# Patient Record
Sex: Female | Born: 1985 | Hispanic: Yes | Marital: Single | Smoking: Never smoker
Health system: Southern US, Community
[De-identification: ages and names within clinical notes are randomized; demographics above are authoritative.]

## PROBLEM LIST (undated history)

## (undated) ENCOUNTER — Inpatient Hospital Stay (HOSPITAL_COMMUNITY): Payer: Self-pay

## (undated) DIAGNOSIS — Z6740 Type O blood, Rh positive: Secondary | ICD-10-CM

## (undated) DIAGNOSIS — E669 Obesity, unspecified: Secondary | ICD-10-CM

## (undated) DIAGNOSIS — D649 Anemia, unspecified: Secondary | ICD-10-CM

## (undated) DIAGNOSIS — J329 Chronic sinusitis, unspecified: Secondary | ICD-10-CM

## (undated) DIAGNOSIS — T7840XA Allergy, unspecified, initial encounter: Secondary | ICD-10-CM

## (undated) DIAGNOSIS — J45909 Unspecified asthma, uncomplicated: Secondary | ICD-10-CM

## (undated) DIAGNOSIS — R112 Nausea with vomiting, unspecified: Secondary | ICD-10-CM

## (undated) DIAGNOSIS — R87619 Unspecified abnormal cytological findings in specimens from cervix uteri: Secondary | ICD-10-CM

## (undated) DIAGNOSIS — IMO0002 Reserved for concepts with insufficient information to code with codable children: Secondary | ICD-10-CM

## (undated) DIAGNOSIS — G43909 Migraine, unspecified, not intractable, without status migrainosus: Secondary | ICD-10-CM

## (undated) DIAGNOSIS — Z9889 Other specified postprocedural states: Secondary | ICD-10-CM

## (undated) HISTORY — DX: Allergy, unspecified, initial encounter: T78.40XA

## (undated) HISTORY — PX: WISDOM TOOTH EXTRACTION: SHX21

## (undated) HISTORY — PX: LAPAROSCOPIC GASTRIC SLEEVE RESECTION: SHX5895

## (undated) HISTORY — DX: Obesity, unspecified: E66.9

## (undated) HISTORY — DX: Migraine, unspecified, not intractable, without status migrainosus: G43.909

## (undated) HISTORY — DX: Chronic sinusitis, unspecified: J32.9

## (undated) HISTORY — DX: Unspecified asthma, uncomplicated: J45.909

## (undated) HISTORY — DX: Unspecified abnormal cytological findings in specimens from cervix uteri: R87.619

## (undated) HISTORY — PX: COLPOSCOPY: SHX161

## (undated) HISTORY — DX: Anemia, unspecified: D64.9

## (undated) HISTORY — DX: Type O blood, Rh positive: Z67.40

## (undated) HISTORY — DX: Reserved for concepts with insufficient information to code with codable children: IMO0002

## (undated) HISTORY — PX: OTHER SURGICAL HISTORY: SHX169

---

## 2006-11-27 ENCOUNTER — Other Ambulatory Visit: Admission: RE | Admit: 2006-11-27 | Discharge: 2006-11-27 | Payer: Self-pay | Admitting: Obstetrics and Gynecology

## 2007-10-31 ENCOUNTER — Other Ambulatory Visit: Admission: RE | Admit: 2007-10-31 | Discharge: 2007-10-31 | Payer: Self-pay | Admitting: Obstetrics and Gynecology

## 2010-03-29 ENCOUNTER — Emergency Department (HOSPITAL_COMMUNITY)
Admission: EM | Admit: 2010-03-29 | Discharge: 2010-03-29 | Payer: Self-pay | Source: Home / Self Care | Admitting: Emergency Medicine

## 2011-05-11 ENCOUNTER — Ambulatory Visit (INDEPENDENT_AMBULATORY_CARE_PROVIDER_SITE_OTHER): Payer: 59 | Admitting: Physician Assistant

## 2011-05-11 VITALS — BP 90/54 | HR 94 | Temp 98.8°F | Resp 18 | Ht 61.0 in | Wt 242.8 lb

## 2011-05-11 DIAGNOSIS — R059 Cough, unspecified: Secondary | ICD-10-CM

## 2011-05-11 DIAGNOSIS — R05 Cough: Secondary | ICD-10-CM

## 2011-05-11 DIAGNOSIS — J019 Acute sinusitis, unspecified: Secondary | ICD-10-CM

## 2011-05-11 MED ORDER — HYDROCODONE-HOMATROPINE 5-1.5 MG/5ML PO SYRP: ORAL_SOLUTION | ORAL | Status: AC

## 2011-05-11 MED ORDER — AZITHROMYCIN 250 MG PO TABS: ORAL_TABLET | ORAL | Status: AC

## 2011-05-11 MED ORDER — IPRATROPIUM BROMIDE 0.06 % NA SOLN: 2.0000 | Freq: Three times a day (TID) | NASAL | Status: DC

## 2011-05-11 NOTE — Progress Notes (Signed)
    Patient ID: Ashley Barnes MRN: [id number], DOB: 1985/04/21, 26 y.o. Date of Encounter: 05/11/2011, 10:03 AM  Primary Physician: Virgilio Belling, PA  Chief Complaint:  Chief Complaint  Patient presents with  . Cough  . Nasal Congestion  . Otalgia  . Sore Throat    HPI: 26 y.o. year old female presents with 1 week history of worsening nasal congestion, post nasal drip, sinus pressure, and cough. Afebrile. No chills. Cough is productive of green sputum, worse in the morning. Nasal congestion thick and green. Sinus pressure greatest along the maxillary sinuses. Hearing muffled bilaterally with right otalgia. No drainage or discharge from the ears. No GI complaints. Has tried Benadryl and Sudafed without success. No sick contacts. No recent antibiotics.   No leg trauma, sedentary periods, h/o cancer, or tobacco use.  Past Medical History  Diagnosis Date  . Obesity      Home Meds: Prior to Admission medications   Not on File    Allergies:  Allergies  Allergen Reactions  . Penicillins Anaphylaxis  . Zocor (Simvastatin - High Dose) Hives and Rash    History   Social History  . Marital Status: Single    Spouse Name: N/A    Number of Children: N/A  . Years of Education: N/A   Occupational History  . Not on file.   Social History Main Topics  . Smoking status: Never Smoker   . Smokeless tobacco: Not on file  . Alcohol Use: Not on file  . Drug Use: Not on file  . Sexually Active: Not on file   Other Topics Concern  . Not on file   Social History Narrative  . No narrative on file     Review of Systems: Constitutional: negative for chills, fever, night sweats or weight changes Cardiovascular: negative for chest pain or palpitations Respiratory: negative for hemoptysis, wheezing, or shortness of breath Abdominal: negative for abdominal pain, nausea, vomiting or diarrhea Dermatological: negative for rash Neurologic: negative for headache   Physical  Exam: Blood pressure 90/54, pulse 94, temperature 98.8 F (37.1 C), temperature source Oral, resp. rate 18, height 5\' 1"  (1.549 m), weight 242 lb 12.8 oz (110.133 kg), last menstrual period 04/13/2011., Body mass index is 45.88 kg/(m^2). General: Well developed, well nourished, in no acute distress. Head: Normocephalic, atraumatic, eyes without discharge, sclera non-icteric, nares are congested. Bilateral auditory canals clear, TM's are without perforation, pearly grey with reflective cone of light bilaterally. Serous effusion bilaterally behind TM's. Maxillary sinus TTP bilaterally. Oral cavity moist, dentition normal. Posterior pharynx with post nasal drip and mild erythema. No peritonsillar abscess or tonsillar exudate. Neck: Supple. No thyromegaly. Full ROM. No lymphadenopathy. Lungs: Clear bilaterally to auscultation without wheezes, rales, or rhonchi. Breathing is unlabored. Heart: RRR with S1 S2. No murmurs, rubs, or gallops appreciated. Msk:  Strength and tone normal for age. Extremities: No clubbing or cyanosis. No edema. Neuro: Alert and oriented X 3. Moves all extremities spontaneously. CNII-XII grossly in tact. Psych:  Responds to questions appropriately with a normal affect.     ASSESSMENT AND PLAN:  26 y.o. year old female with sinobronchitis -Azithromycin 250 MG #6 2 po first day then 1 po next 4 days no RF -Atrovent NS 0.06% 2 sprays each nare bid prn #1 no RF Hycodan #4oz 1 tsp po q 4-6 hours prn cough no RF SED -Mucinex -Tylenol/Motrin prn -Rest/fluids -RTC precautions -RTC 3-5 days if no improvement  Signed, Eula Listen, PA-C 05/11/2011 10:03 AM

## 2011-05-13 ENCOUNTER — Telehealth: Payer: Self-pay

## 2011-05-13 NOTE — Telephone Encounter (Signed)
Pt states seen on Wednesday this week and needs an OOW note for Wednesday, Thursday, and Friday (today). States she is still not feeling better. 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  Pt ph: [phone]        Work fax: [phone]  Also had a question regarding her treatment. States she is on day 3 of her 4 day antibiotic for her ear. Asks when she should expect her ear to start feeling better because it still feels bad.   bf

## 2011-05-16 ENCOUNTER — Telehealth: Payer: Self-pay

## 2011-05-16 NOTE — Telephone Encounter (Signed)
Pt CB concerning OOW note and also reports that her cough is better after Abx, but her nose is still running and her ears are still bothering her and feel like there is "something in them" and popping/pressure. She requests another Abx, if possible, that her ears might respond better to.

## 2011-05-16 NOTE — Telephone Encounter (Signed)
Gave pt info from Maralyn Sago - pt agreed and faxed OOW note OK'd by Alycia Rossetti (see previous phone message)

## 2011-05-16 NOTE — Telephone Encounter (Signed)
Pt reports her medications had been sent to the wrong pharmacy last time. They should be [facility] to [facility] on [address] and [address], not the one on corner of [address]. I have changed pharmacy in her demographics, but may still need changed when meds are ordered.

## 2011-05-16 NOTE — Telephone Encounter (Signed)
Looks like pt had ETD and sinusitis - the ABX will treat the sinusitis which will allow the ears to improve but it may take 1-3 wks for ear fluid to drain properly.  Continue atrovent NS and mucinex that will hwlp with her ears.  Also take otc NSAIDs for the pain.

## 2011-05-17 NOTE — Telephone Encounter (Signed)
Spoke with pt, see notes in phone message 05/16/11

## 2011-12-23 ENCOUNTER — Ambulatory Visit (INDEPENDENT_AMBULATORY_CARE_PROVIDER_SITE_OTHER): Payer: 59 | Admitting: Physician Assistant

## 2011-12-23 VITALS — BP 126/77 | HR 107 | Temp 98.3°F | Resp 17 | Ht 60.5 in | Wt 224.0 lb

## 2011-12-23 DIAGNOSIS — J019 Acute sinusitis, unspecified: Secondary | ICD-10-CM

## 2011-12-23 DIAGNOSIS — J45909 Unspecified asthma, uncomplicated: Secondary | ICD-10-CM

## 2011-12-23 DIAGNOSIS — J309 Allergic rhinitis, unspecified: Secondary | ICD-10-CM | POA: Insufficient documentation

## 2011-12-23 MED ORDER — MONTELUKAST SODIUM 10 MG PO TABS: 10.0000 mg | ORAL_TABLET | Freq: Every day | ORAL | Status: DC

## 2011-12-23 MED ORDER — GUAIFENESIN ER 1200 MG PO TB12: 1.0000 | ORAL_TABLET | Freq: Two times a day (BID) | ORAL | Status: DC | PRN

## 2011-12-23 MED ORDER — IPRATROPIUM BROMIDE 0.03 % NA SOLN: 2.0000 | Freq: Two times a day (BID) | NASAL | Status: DC

## 2011-12-23 MED ORDER — BENZONATATE 100 MG PO CAPS: 100.0000 mg | ORAL_CAPSULE | Freq: Three times a day (TID) | ORAL | Status: DC | PRN

## 2011-12-23 MED ORDER — CLARITHROMYCIN ER 500 MG PO TB24: 1000.0000 mg | ORAL_TABLET | Freq: Every day | ORAL | Status: DC

## 2011-12-23 NOTE — Patient Instructions (Signed)
Get plenty of rest and drink at least 64 ounces of water daily. 

## 2011-12-23 NOTE — Progress Notes (Signed)
  Subjective:    Patient ID: Ashley Barnes, female    DOB: [birth year], 26 y.o.   MRN: [id number]  HPI  This 26 y.o. female presents for evaluation of scratchy sore throat beginning 12/19/2011 that has progressed to sneezing, coughing, mucous drainage, ear pooping, fullness and ache.    Has had asthma, recurrent sinusitis and otitis since early childhood.  Antihistamines make her feel dopey, so she doesn't take them, other than Benadryl at bedtime to help her sleep.  Claritin, Zyrtec and Allegra all cause the same sleepiness.   Review of Systems  As above.  No GI/GU symptoms.  No fever, chills.  No unexplained myalgias, arthralgias or rash.  Past Medical History  Diagnosis Date  . Obesity   . Allergy   . Asthma   . Recurrent sinusitis     Past Surgical History  Procedure Date  . Wisdom tooth extraction     Prior to Admission medications   Not on File    Allergies  Allergen Reactions  . Penicillins Anaphylaxis  . Corticosteroids Hives  . Zocor (Simvastatin - High Dose) Hives and Rash    History   Social History  . Marital Status: Single    Spouse Name: n/a    Number of Children: 0  . 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 Smoking status: Never Smoker   . Smokeless tobacco: Never Used  . Alcohol Use: No  . Drug Use: No  . Sexually Active: Not Currently   Other Topics Concern  . Not on file   Social History Narrative   Lives alone.    Family History  Problem Relation Age of Onset  . Diabetes Mother 28  . Heart disease Father 57    AMI x 3  . Hypertension Father        Objective:   Physical Exam  Blood pressure 126/77, pulse 107, temperature 98.3 F (36.8 C), temperature source Oral, resp. rate 17, height 5' 0.5" (1.537 m), weight 224 lb (101.606 kg), last menstrual period 12/18/2011, SpO2 96.00%. Body mass index is 43.03 kg/(m^2). Well-developed, well nourished BF who  is awake, alert and oriented, in NAD. HEENT: [place]/AT, PERRL, EOMI.  Sclera and conjunctiva are clear.  EAC are patent, TMs are normal in appearance. Nasal mucosa is congested, dark pink and moist. OP is clear. Frontal and maxillary sinuses are very tender. Neck: supple, no lymphadenopathy, no thyromegaly. Tender bilaterally. Heart: RRR, no murmur Lungs: normal effort, CTA Extremities: no cyanosis, clubbing or edema. Skin: warm and dry without rash. Psychologic: good mood and appropriate affect, normal speech and behavior.     Assessment & Plan:   1. Asthma  montelukast (SINGULAIR) 10 MG tablet  2. AR (allergic rhinitis)  ipratropium (ATROVENT) 0.03 % nasal spray, montelukast (SINGULAIR) 10 MG tablet  3. Acute sinusitis, unspecified  ipratropium (ATROVENT) 0.03 % nasal spray, Guaifenesin (MUCINEX MAXIMUM STRENGTH) 1200 MG TB12, clarithromycin (BIAXIN XL) 500 MG 24 hr tablet, benzonatate (TESSALON) 100 MG capsule   Unable to use steroid nasal spray due to allergy.  Antihistamines cause drowsiness.

## 2011-12-26 ENCOUNTER — Telehealth: Payer: Self-pay

## 2011-12-26 DIAGNOSIS — J309 Allergic rhinitis, unspecified: Secondary | ICD-10-CM

## 2011-12-26 DIAGNOSIS — J45909 Unspecified asthma, uncomplicated: Secondary | ICD-10-CM

## 2011-12-26 MED ORDER — MONTELUKAST SODIUM 10 MG PO TABS: 10.0000 mg | ORAL_TABLET | Freq: Every day | ORAL | Status: DC

## 2011-12-26 NOTE — Telephone Encounter (Signed)
Pt went to get her singlair filled and the pharmacy told her it needed to be a 90 day rx. Please call patient with any questions at [phone]

## 2011-12-26 NOTE — Telephone Encounter (Signed)
Changed Rx to 90 day w/1 RF and sent to pharmacy. Notified pt on VM done.

## 2012-06-25 ENCOUNTER — Encounter: Payer: Self-pay | Admitting: *Deleted

## 2012-06-25 ENCOUNTER — Ambulatory Visit (INDEPENDENT_AMBULATORY_CARE_PROVIDER_SITE_OTHER): Payer: 59 | Admitting: Family Medicine

## 2012-06-25 VITALS — BP 110/68 | HR 70 | Temp 98.3°F | Resp 16 | Ht 60.75 in | Wt 218.0 lb

## 2012-06-25 DIAGNOSIS — H6093 Unspecified otitis externa, bilateral: Secondary | ICD-10-CM

## 2012-06-25 DIAGNOSIS — H60399 Other infective otitis externa, unspecified ear: Secondary | ICD-10-CM

## 2012-06-25 DIAGNOSIS — J019 Acute sinusitis, unspecified: Secondary | ICD-10-CM

## 2012-06-25 MED ORDER — OFLOXACIN 0.3 % OT SOLN: 10.0000 [drp] | Freq: Every day | OTIC | Status: DC

## 2012-06-25 MED ORDER — AZITHROMYCIN 500 MG PO TABS: 500.0000 mg | ORAL_TABLET | Freq: Every day | ORAL | Status: DC

## 2012-06-25 NOTE — Patient Instructions (Signed)
Sinusitis Sinusitis is redness, soreness, and swelling (inflammation) of the paranasal sinuses. Paranasal sinuses are air pockets within the bones of your face (beneath the eyes, the middle of the forehead, or above the eyes). In healthy paranasal sinuses, mucus is able to drain out, and air is able to circulate through them by way of your nose. However, when your paranasal sinuses are inflamed, mucus and air can become trapped. This can allow bacteria and other germs to grow and cause infection. Sinusitis can develop quickly and last only a short time (acute) or continue over a long period (chronic). Sinusitis that lasts for more than 12 weeks is considered chronic.  CAUSES  Causes of sinusitis include:  Allergies.  Structural abnormalities, such as displacement of the cartilage that separates your nostrils (deviated septum), which can decrease the air flow through your nose and sinuses and affect sinus drainage.  Functional abnormalities, such as when the small hairs (cilia) that line your sinuses and help remove mucus do not work properly or are not present. SYMPTOMS  Symptoms of acute and chronic sinusitis are the same. The primary symptoms are pain and pressure around the affected sinuses. Other symptoms include:  Upper toothache.  Earache.  Headache.  Bad breath.  Decreased sense of smell and taste.  A cough, which worsens when you are lying flat.  Fatigue.  Fever.  Thick drainage from your nose, which often is green and may contain pus (purulent).  Swelling and warmth over the affected sinuses. DIAGNOSIS  Your caregiver will perform a physical exam. During the exam, your caregiver may:  Look in your nose for signs of abnormal growths in your nostrils (nasal polyps).  Tap over the affected sinus to check for signs of infection.  View the inside of your sinuses (endoscopy) with a special imaging device with a light attached (endoscope), which is inserted into your  sinuses. If your caregiver suspects that you have chronic sinusitis, one or more of the following tests may be recommended:  Allergy tests.  Nasal culture A sample of mucus is taken from your nose and sent to a lab and screened for bacteria.  Nasal cytology A sample of mucus is taken from your nose and examined by your caregiver to determine if your sinusitis is related to an allergy. TREATMENT  Most cases of acute sinusitis are related to a viral infection and will resolve on their own within 10 days. Sometimes medicines are prescribed to help relieve symptoms (pain medicine, decongestants, nasal steroid sprays, or saline sprays).  However, for sinusitis related to a bacterial infection, your caregiver will prescribe antibiotic medicines. These are medicines that will help kill the bacteria causing the infection.  Rarely, sinusitis is caused by a fungal infection. In theses cases, your caregiver will prescribe antifungal medicine. For some cases of chronic sinusitis, surgery is needed. Generally, these are cases in which sinusitis recurs more than 3 times per year, despite other treatments. HOME CARE INSTRUCTIONS   Drink plenty of water. Water helps thin the mucus so your sinuses can drain more easily.  Use a humidifier.  Inhale steam 3 to 4 times a day (for example, sit in the bathroom with the shower running).  Apply a warm, moist washcloth to your face 3 to 4 times a day, or as directed by your caregiver.  Use saline nasal sprays to help moisten and clean your sinuses.  Take over-the-counter or prescription medicines for pain, discomfort, or fever only as directed by your caregiver. SEEK IMMEDIATE MEDICAL   CARE IF:  You have increasing pain or severe headaches.  You have nausea, vomiting, or drowsiness.  You have swelling around your face.  You have vision problems.  You have a stiff neck.  You have difficulty breathing. MAKE SURE YOU:   Understand these  instructions.  Will watch your condition.  Will get help right away if you are not doing well or get worse. Document Released: 02/21/2005 Document Revised: 05/16/2011 Document Reviewed: 03/08/2011 ExitCare Patient Information 2013 [employer]. Otitis Externa Otitis externa is a bacterial or fungal infection of the outer ear canal. This is the area from the eardrum to the outside of the ear. Otitis externa is sometimes called "swimmer's ear." CAUSES  Possible causes of infection include:  Swimming in dirty water.  Moisture remaining in the ear after swimming or bathing.  Mild injury (trauma) to the ear.  Objects stuck in the ear (foreign body).  Cuts or scrapes (abrasions) on the outside of the ear. SYMPTOMS  The first symptom of infection is often itching in the ear canal. Later signs and symptoms may include swelling and redness of the ear canal, ear pain, and yellowish-white fluid (pus) coming from the ear. The ear pain may be worse when pulling on the earlobe. DIAGNOSIS  Your caregiver will perform a physical exam. A sample of fluid may be taken from the ear and examined for bacteria or fungi. TREATMENT  Antibiotic ear drops are often given for 10 to 14 days. Treatment may also include pain medicine or corticosteroids to reduce itching and swelling. PREVENTION   Keep your ear dry. Use the corner of a towel to absorb water out of the ear canal after swimming or bathing.  Avoid scratching or putting objects inside your ear. This can damage the ear canal or remove the protective wax that lines the canal. This makes it easier for bacteria and fungi to grow.  Avoid swimming in lakes, polluted water, or poorly chlorinated pools.  You may use ear drops made of rubbing alcohol and vinegar after swimming. Combine equal parts of white vinegar and alcohol in a bottle. Put 3 or 4 drops into each ear after swimming. HOME CARE INSTRUCTIONS   Apply antibiotic ear drops to the ear canal  as prescribed by your caregiver.  Only take over-the-counter or prescription medicines for pain, discomfort, or fever as directed by your caregiver.  If you have diabetes, follow any additional treatment instructions from your caregiver.  Keep all follow-up appointments as directed by your caregiver. SEEK MEDICAL CARE IF:   You have a fever.  Your ear is still red, swollen, painful, or draining pus after 3 days.  Your redness, swelling, or pain gets worse.  You have a severe headache.  You have redness, swelling, pain, or tenderness in the area behind your ear. MAKE SURE YOU:   Understand these instructions.  Will watch your condition.  Will get help right away if you are not doing well or get worse. Document Released: 02/21/2005 Document Revised: 05/16/2011 Document Reviewed: 03/10/2011 ExitCare Patient Information 2013 [employer].  

## 2012-06-25 NOTE — Progress Notes (Signed)
[facility]:  Office Visit  Chief Complaint:  Chief Complaint  Patient presents with  . Otalgia  . Cough    HPI: Ashley Barnes is a 27 y.o. female who complains of  4 day history of sharp ear pain, bilateral , right worse than left. She is training for triathlon. She has been swimming. She has sinus pain. No fevers or chills. No SOB or wheezing. Hx of asthma/allergies. Has taken her allergy meds without relief. Nonsmoker.    Past Medical History  Diagnosis Date  . Obesity   . Allergy   . Asthma   . Recurrent sinusitis    Past Surgical History  Procedure Laterality Date  . Wisdom tooth extraction     History   Social History  . Marital Status: Single    Spouse Name: n/a    Number of Children: 0  . 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 Smoking status: Never Smoker   . Smokeless tobacco: Never Used  . Alcohol Use: No  . Drug Use: No  . Sexually Active: Not Currently   Other Topics Concern  . None   Social History Narrative   Lives alone.   Family History  Problem Relation Age of Onset  . Diabetes Mother 85  . Heart disease Father 22    AMI x 3  . Hypertension Father    Allergies  Allergen Reactions  . Penicillins Anaphylaxis  . Corticosteroids Hives  . Zocor (Simvastatin - High Dose) Hives and Rash   Prior to Admission medications   Medication Sig Start Date End Date Taking? Authorizing Provider  montelukast (SINGULAIR) 10 MG tablet Take 1 tablet (10 mg total) by mouth at bedtime. 12/26/11  Yes Chelle S Jeffery, PA-C  benzonatate (TESSALON) 100 MG capsule Take 1-2 capsules (100-200 mg total) by mouth 3 (three) times daily as needed for cough. 12/23/11   Chelle S Jeffery, PA-C  clarithromycin (BIAXIN XL) 500 MG 24 hr tablet Take 2 tablets (1,000 mg total) by mouth daily. 12/23/11   Chelle S Jeffery, PA-C  Guaifenesin (MUCINEX MAXIMUM STRENGTH) 1200 MG TB12  Take 1 tablet (1,200 mg total) by mouth every 12 (twelve) hours as needed. 12/23/11   Chelle S Jeffery, PA-C  ipratropium (ATROVENT) 0.03 % nasal spray Place 2 sprays into the nose 2 (two) times daily. 12/23/11   Chelle S Jeffery, PA-C     ROS: The patient denies fevers, chills, night sweats, unintentional weight loss, chest pain, palpitations, wheezing, dyspnea on exertion, nausea, vomiting, abdominal pain, dysuria, hematuria, melena, numbness, weakness, or tingling.   All other systems have been reviewed and were otherwise negative with the exception of those mentioned in the HPI and as above.    PHYSICAL EXAM: Filed Vitals:   06/25/12 1651  BP: 110/68  Pulse: 70  Temp: 98.3 F (36.8 C)  Resp: 16   Filed Vitals:   06/25/12 1651  Height: 5' 0.75" (1.543 m)  Weight: 218 lb (98.884 kg)   Body mass index is 41.53 kg/(m^2).  General: Alert, no acute distress HEENT:  Normocephalic, atraumatic, oropharynx patent. Right TM  Bulging, + erythematous external canal, painful at tragus and with palpation. + nil sinus tenderness, erythematous nasal mucosa, no exudates, tonsils nl Cardiovascular:  Regular rate and rhythm, no rubs murmurs or gallops.  No Carotid bruits, radial pulse intact. No pedal edema.  Respiratory: Clear to auscultation bilaterally.  No wheezes, rales, or rhonchi.  No cyanosis, no use of accessory musculature GI: No organomegaly, abdomen is soft and non-tender, positive bowel sounds.  No masses. Skin: No rashes. Neurologic: Facial musculature symmetric. Psychiatric: Patient is appropriate throughout our interaction. Lymphatic: No cervical lymphadenopathy Musculoskeletal: Gait intact.   LABS: No results found for this or any previous visit.   EKG/XRAY:   Primary read interpreted by Dr. Conley Rolls at [facility].   ASSESSMENT/PLAN: Encounter Diagnoses  Name Primary?  . Acute sinusitis Yes  . Otitis external, bilateral    Rx Ocuflox for EO Rx azithromycin 500 mg daily x 5  days Avoid swimmignuntil sxs resolved and finished abx F/u prn    Nolah Krenzer PHUONG, DO 06/25/2012 5:14 PM

## 2012-09-01 ENCOUNTER — Ambulatory Visit (INDEPENDENT_AMBULATORY_CARE_PROVIDER_SITE_OTHER): Payer: 59 | Admitting: Emergency Medicine

## 2012-09-01 VITALS — BP 106/68 | HR 82 | Temp 98.2°F | Resp 16 | Ht 60.0 in | Wt 220.0 lb

## 2012-09-01 DIAGNOSIS — W57XXXA Bitten or stung by nonvenomous insect and other nonvenomous arthropods, initial encounter: Secondary | ICD-10-CM

## 2012-09-01 DIAGNOSIS — T148XXA Other injury of unspecified body region, initial encounter: Secondary | ICD-10-CM

## 2012-09-01 DIAGNOSIS — T148 Other injury of unspecified body region: Secondary | ICD-10-CM

## 2012-09-01 DIAGNOSIS — T1490XA Injury, unspecified, initial encounter: Secondary | ICD-10-CM

## 2012-09-01 MED ORDER — CETIRIZINE HCL 10 MG PO TABS: 10.0000 mg | ORAL_TABLET | Freq: Every day | ORAL | Status: DC

## 2012-09-01 NOTE — Progress Notes (Signed)
  Subjective:    Patient ID: Ashley Barnes, female    DOB: [birth year], 27 y.o.   MRN: [id number]  HPI 27 year old female presents with itchy, burning bumps covering lower legs  Went to [place], [place] for 5 days 08/26/12-08/31/12--- returned yesterday  patient went to beach, did not lay on beach   LMP 4 days ago  Left ear itchy and sore  Review of Systems     Objective:   Physical Exam Multiple bite-like areas over both lower legs. There are no lesions present on her trunk or arms. There are no lesions on her hand       Assessment & Plan:  These areas are most consistent with sand flea bites. She is allergic to cortisone. We'll treat with Benadryl Zyrtec and Zantac. She can apply drying solutions witch hazel or alcohol which ever she prefers .

## 2013-01-07 ENCOUNTER — Telehealth: Payer: Self-pay | Admitting: Certified Nurse Midwife

## 2013-01-07 ENCOUNTER — Telehealth: Payer: Self-pay | Admitting: Emergency Medicine

## 2013-01-07 NOTE — Telephone Encounter (Signed)
agree

## 2013-01-07 NOTE — Telephone Encounter (Signed)
Patient wants to speak with nurse re: "extremely heavy periods?" Please advise? Patient has AEX 01/10/13 but wants to be sure she doesn't need to be seen sooner.

## 2013-01-07 NOTE — Telephone Encounter (Signed)
Encounter opened in erroneously.

## 2013-01-07 NOTE — Telephone Encounter (Signed)
Spoke with patient. She currently is on her period. Last night used 3 super tampons over 9 hour period, having some clots as well. Currently not on any birth control. Last coitus in July. States that she is not pregnant, has been having monthly periods. She has a history of heavy periods and used birth control to control heavy periods. Would like to discuss other options with Debbi regarding birth control that is not the pill. Patient has aex scheduled for 11/6. Declines OV appointment for today to discuss bleeding issues. Advised patient to return call if bleeding increases, patient agreeable.

## 2013-01-09 ENCOUNTER — Encounter: Payer: Self-pay | Admitting: Certified Nurse Midwife

## 2013-01-10 ENCOUNTER — Encounter: Payer: Self-pay | Admitting: Certified Nurse Midwife

## 2013-01-10 ENCOUNTER — Ambulatory Visit (INDEPENDENT_AMBULATORY_CARE_PROVIDER_SITE_OTHER): Payer: 59 | Admitting: Certified Nurse Midwife

## 2013-01-10 VITALS — BP 112/70 | HR 68 | Resp 16 | Ht 61.0 in | Wt 232.0 lb

## 2013-01-10 DIAGNOSIS — Z01419 Encounter for gynecological examination (general) (routine) without abnormal findings: Secondary | ICD-10-CM

## 2013-01-10 DIAGNOSIS — N920 Excessive and frequent menstruation with regular cycle: Secondary | ICD-10-CM

## 2013-01-10 DIAGNOSIS — Z0000 Encounter for general adult medical examination without abnormal findings: Secondary | ICD-10-CM

## 2013-01-10 LAB — LIPID PANEL
LDL Cholesterol: 120 mg/dL — ABNORMAL HIGH (ref 0–99)
Total CHOL/HDL Ratio: 4.1 Ratio
VLDL: 24 mg/dL (ref 0–40)

## 2013-01-10 LAB — COMPREHENSIVE METABOLIC PANEL
ALT: 13 U/L (ref 0–35)
AST: 21 U/L (ref 0–37)
Albumin: 4.1 g/dL (ref 3.5–5.2)
CO2: 28 mEq/L (ref 19–32)
Calcium: 9.6 mg/dL (ref 8.4–10.5)
Chloride: 103 mEq/L (ref 96–112)
Creat: 0.89 mg/dL (ref 0.50–1.10)
Potassium: 4.3 mEq/L (ref 3.5–5.3)
Total Protein: 7.2 g/dL (ref 6.0–8.3)

## 2013-01-10 LAB — POCT URINALYSIS DIPSTICK
Glucose, UA: NEGATIVE
Urobilinogen, UA: NEGATIVE
pH, UA: 5

## 2013-01-10 LAB — HEMOGLOBIN A1C: Hgb A1c MFr Bld: 5.2 % (ref <5.7)

## 2013-01-10 LAB — HEMOGLOBIN, FINGERSTICK: Hemoglobin, fingerstick: 12.7 g/dL (ref 12.0–16.0)

## 2013-01-10 MED ORDER — ETONOGESTREL-ETHINYL ESTRADIOL 0.12-0.015 MG/24HR VA RING: 1.0000 | VAGINAL_RING | Freq: Once | VAGINAL | Status: DC

## 2013-01-10 NOTE — Patient Instructions (Signed)
General topics  Next pap or exam is  due in 1 year Take a Women's multivitamin Take 1200 mg. of calcium daily - prefer dietary If any concerns in interim to call back  Breast Self-Awareness Practicing breast self-awareness may pick up problems early, prevent significant medical complications, and possibly save your life. By practicing breast self-awareness, you can become familiar with how your breasts look and feel and if your breasts are changing. This allows you to notice changes early. It can also offer you some reassurance that your breast health is good. One way to learn what is normal for your breasts and whether your breasts are changing is to do a breast self-exam. If you find a lump or something that was not present in the past, it is best to contact your caregiver right away. Other findings that should be evaluated by your caregiver include nipple discharge, especially if it is bloody; skin changes or reddening; areas where the skin seems to be pulled in (retracted); or new lumps and bumps. Breast pain is seldom associated with cancer (malignancy), but should also be evaluated by a caregiver. BREAST SELF-EXAM The best time to examine your breasts is 5 7 days after your menstrual period is over.  ExitCare Patient Information 2013 [employer].   Exercise to Stay Healthy Exercise helps you become and stay healthy. EXERCISE IDEAS AND TIPS Choose exercises that:  You enjoy.  Fit into your day. You do not need to exercise really hard to be healthy. You can do exercises at a slow or medium level and stay healthy. You can:  Stretch before and after working out.  Try yoga, Pilates, or tai chi.  Lift weights.  Walk fast, swim, jog, run, climb stairs, bicycle, dance, or rollerskate.  Take aerobic classes. Exercises that burn about 150 calories:  Running 1  miles in 15 minutes.  Playing volleyball for 45 to 60 minutes.  Washing and waxing a car for 45 to 60  minutes.  Playing touch football for 45 minutes.  Walking 1  miles in 35 minutes.  Pushing a stroller 1  miles in 30 minutes.  Playing basketball for 30 minutes.  Raking leaves for 30 minutes.  Bicycling 5 miles in 30 minutes.  Walking 2 miles in 30 minutes.  Dancing for 30 minutes.  Shoveling snow for 15 minutes.  Swimming laps for 20 minutes.  Walking up stairs for 15 minutes.  Bicycling 4 miles in 15 minutes.  Gardening for 30 to 45 minutes.  Jumping rope for 15 minutes.  Washing windows or floors for 45 to 60 minutes. Document Released: 03/26/2010 Document Revised: 05/16/2011 Document Reviewed: 03/26/2010 ExitCare Patient Information 2013 [employer].   Other topics ( that may be useful information):    Sexually Transmitted Disease Sexually transmitted disease (STD) refers to any infection that is passed from person to person during sexual activity. This may happen by way of saliva, semen, blood, vaginal mucus, or urine. Common STDs include:  Gonorrhea.  Chlamydia.  Syphilis.  HIV/AIDS.  Genital herpes.  Hepatitis B and C.  Trichomonas.  Human papillomavirus (HPV).  Pubic lice. CAUSES  An STD may be spread by bacteria, virus, or parasite. A person can get an STD by:  Sexual intercourse with an infected person.  Sharing sex toys with an infected person.  Sharing needles with an infected person.  Having intimate contact with the genitals, mouth, or rectal areas of an infected person. SYMPTOMS  Some people may not have any symptoms, but   they can still pass the infection to others. Different STDs have different symptoms. Symptoms include:  Painful or bloody urination.  Pain in the pelvis, abdomen, vagina, anus, throat, or eyes.  Skin rash, itching, irritation, growths, or sores (lesions). These usually occur in the genital or anal area.  Abnormal vaginal discharge.  Penile discharge in men.  Soft, flesh-colored skin growths in the  genital or anal area.  Fever.  Pain or bleeding during sexual intercourse.  Swollen glands in the groin area.  Yellow skin and eyes (jaundice). This is seen with hepatitis. DIAGNOSIS  To make a diagnosis, your caregiver may:  Take a medical history.  Perform a physical exam.  Take a specimen (culture) to be examined.  Examine a sample of discharge under a microscope.  Perform blood test TREATMENT   Chlamydia, gonorrhea, trichomonas, and syphilis can be cured with antibiotic medicine.  Genital herpes, hepatitis, and HIV can be treated, but not cured, with prescribed medicines. The medicines will lessen the symptoms.  Genital warts from HPV can be treated with medicine or by freezing, burning (electrocautery), or surgery. Warts may come back.  HPV is a virus and cannot be cured with medicine or surgery.However, abnormal areas may be followed very closely by your caregiver and may be removed from the cervix, vagina, or vulva through office procedures or surgery. If your diagnosis is confirmed, your recent sexual partners need treatment. This is true even if they are symptom-free or have a negative culture or evaluation. They should not have sex until their caregiver says it is okay. HOME CARE INSTRUCTIONS  All sexual partners should be informed, tested, and treated for all STDs.  Take your antibiotics as directed. Finish them even if you start to feel better.  Only take over-the-counter or prescription medicines for pain, discomfort, or fever as directed by your caregiver.  Rest.  Eat a balanced diet and drink enough fluids to keep your urine clear or pale yellow.  Do not have sex until treatment is completed and you have followed up with your caregiver. STDs should be checked after treatment.  Keep all follow-up appointments, Pap tests, and blood tests as directed by your caregiver.  Only use latex condoms and water-soluble lubricants during sexual activity. Do not use  petroleum jelly or oils.  Avoid alcohol and illegal drugs.  Get vaccinated for HPV and hepatitis. If you have not received these vaccines in the past, talk to your caregiver about whether one or both might be right for you.  Avoid risky sex practices that can break the skin. The only way to avoid getting an STD is to avoid all sexual activity.Latex condoms and dental dams (for oral sex) will help lessen the risk of getting an STD, but will not completely eliminate the risk. SEEK MEDICAL CARE IF:   You have a fever.  You have any new or worsening symptoms. Document Released: 05/14/2002 Document Revised: 05/16/2011 Document Reviewed: 05/21/2010 ExitCare Patient Information 2013 [employer].    Domestic Abuse You are being battered or abused if someone close to you hits, pushes, or physically hurts you in any way. You also are being abused if you are forced into activities. You are being sexually abused if you are forced to have sexual contact of any kind. You are being emotionally abused if you are made to feel worthless or if you are constantly threatened. It is important to remember that help is available. No one has the right to abuse you. PREVENTION OF FURTHER   ABUSE  Learn the warning signs of danger. This varies with situations but may include: the use of alcohol, threats, isolation from friends and family, or forced sexual contact. Leave if you feel that violence is going to occur.  If you are attacked or beaten, report it to the police so the abuse is documented. You do not have to press charges. The police can protect you while you or the attackers are leaving. Get the officer's name and badge number and a copy of the report.  Find someone you can trust and tell them what is happening to you: your caregiver, a nurse, clergy member, close friend or family member. Feeling ashamed is natural, but remember that you have done nothing wrong. No one deserves abuse. Document Released:  02/19/2000 Document Revised: 05/16/2011 Document Reviewed: 04/29/2010 ExitCare Patient Information 2013 [employer].    How Much is Too Much Alcohol? Drinking too much alcohol can cause injury, accidents, and health problems. These types of problems can include:   Car crashes.  Falls.  Family fighting (domestic violence).  Drowning.  Fights.  Injuries.  Burns.  Damage to certain organs.  Having a baby with birth defects. ONE DRINK CAN BE TOO MUCH WHEN YOU ARE:  Working.  Pregnant or breastfeeding.  Taking medicines. Ask your doctor.  Driving or planning to drive. If you or someone you know has a drinking problem, get help from a doctor.  Document Released: 12/18/2008 Document Revised: 05/16/2011 Document Reviewed: 12/18/2008 ExitCare Patient Information 2013 [employer].   Smoking Hazards Smoking cigarettes is extremely bad for your health. Tobacco smoke has over 200 known poisons in it. There are over 60 chemicals in tobacco smoke that cause cancer. Some of the chemicals found in cigarette smoke include:   Cyanide.  Benzene.  Formaldehyde.  Methanol (wood alcohol).  Acetylene (fuel used in welding torches).  Ammonia. Cigarette smoke also contains the poisonous gases nitrogen oxide and carbon monoxide.  Cigarette smokers have an increased risk of many serious medical problems and Smoking causes approximately:  90% of all lung cancer deaths in men.  80% of all lung cancer deaths in women.  90% of deaths from chronic obstructive lung disease. Compared with nonsmokers, smoking increases the risk of:  Coronary heart disease by 2 to 4 times.  Stroke by 2 to 4 times.  Men developing lung cancer by 23 times.  Women developing lung cancer by 13 times.  Dying from chronic obstructive lung diseases by 12 times.  . Smoking is the most preventable cause of death and disease in our society.  WHY IS SMOKING ADDICTIVE?  Nicotine is the chemical  agent in tobacco that is capable of causing addiction or dependence.  When you smoke and inhale, nicotine is absorbed rapidly into the bloodstream through your lungs. Nicotine absorbed through the lungs is capable of creating a powerful addiction. Both inhaled and non-inhaled nicotine may be addictive.  Addiction studies of cigarettes and spit tobacco show that addiction to nicotine occurs mainly during the teen years, when young people begin using tobacco products. WHAT ARE THE BENEFITS OF QUITTING?  There are many health benefits to quitting smoking.   Likelihood of developing cancer and heart disease decreases. Health improvements are seen almost immediately.  Blood pressure, pulse rate, and breathing patterns start returning to normal soon after quitting. QUITTING SMOKING   American Lung Association - 1-800-LUNGUSA  American Cancer Society - 1-800-ACS-2345 Document Released: 03/31/2004 Document Revised: 05/16/2011 Document Reviewed: 12/03/2008 ExitCare Patient Information 2013 ExitCare[employer]   LL[employer].   Stress Management Stress is a state of physical or mental tension that often results from changes in your life or normal routine. Some common causes of stress are:  Death of a loved one.  Injuries or severe illnesses.  Getting fired or changing jobs.  Moving into a new home. Other causes may be:  Sexual problems.  Business or financial losses.  Taking on a large debt.  Regular conflict with someone at home or at work.  Constant tiredness from lack of sleep. It is not just bad things that are stressful. It may be stressful to:  Win the lottery.  Get married.  Buy a new car. The amount of stress that can be easily tolerated varies from person to person. Changes generally cause stress, regardless of the types of change. Too much stress can affect your health. It may lead to physical or emotional problems. Too little stress (boredom) may also become stressful. SUGGESTIONS TO  REDUCE STRESS:  Talk things over with your family and friends. It often is helpful to share your concerns and worries. If you feel your problem is serious, you may want to get help from a professional counselor.  Consider your problems one at a time instead of lumping them all together. Trying to take care of everything at once may seem impossible. List all the things you need to do and then start with the most important one. Set a goal to accomplish 2 or 3 things each day. If you expect to do too many in a single day you will naturally fail, causing you to feel even more stressed.  Do not use alcohol or drugs to relieve stress. Although you may feel better for a short time, they do not remove the problems that caused the stress. They can also be habit forming.  Exercise regularly - at least 3 times per week. Physical exercise can help to relieve that "uptight" feeling and will relax you.  The shortest distance between despair and hope is often a good night's sleep.  Go to bed and get up on time allowing yourself time for appointments without being rushed.  Take a short "time-out" period from any stressful situation that occurs during the day. Close your eyes and take some deep breaths. Starting with the muscles in your face, tense them, hold it for a few seconds, then relax. Repeat this with the muscles in your neck, shoulders, hand, stomach, back and legs.  Take good care of yourself. Eat a balanced diet and get plenty of rest.  Schedule time for having fun. Take a break from your daily routine to relax. HOME CARE INSTRUCTIONS   Call if you feel overwhelmed by your problems and feel you can no longer manage them on your own.  Return immediately if you feel like hurting yourself or someone else. Document Released: 08/17/2000 Document Revised: 05/16/2011 Document Reviewed: 04/09/2007 ExitCare Patient Information 2013 [employer]Care[employer].   

## 2013-01-10 NOTE — Progress Notes (Signed)
27 y.o. G0P0000 Single Hispanic Fe here for annual exam. Periods much heavier and more cramping. Patient also has had some spotting in between cycles. Desires cycle control again, but would prefer no OCP. Not sexually active, no STD concerns or testing needed. Working on [employer] control again with trainer and diet. Needs fasting labs for work requirements today. No other health issues.  Patient's last menstrual period was 01/04/2013.          Sexually active: no  The current method of family planning is abstinence.    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 She has never used smokeless tobacco. She reports that she does not drink alcohol or use illicit drugs.  Past Medical History  Diagnosis Date  . Obesity   . Allergy   . Asthma   . Recurrent sinusitis   . Anemia   . Abnormal Pap smear     12-29-09 ASCUS+HPV, 01-04-12 LSIL  . Migraines     no aura    Past Surgical History  Procedure Laterality Date  . Wisdom tooth extraction    . Colposcopy      2011 & 2013 CIN1    Current Outpatient Prescriptions  Medication Sig Dispense Refill  . B Complex Vitamins (B COMPLEX PO) Take by mouth daily.      Marland Kitchen CALCIUM-MAGNESIUM-ZINC PO Take by mouth daily.      . Cholecalciferol (VITAMIN D PO) Take by mouth daily.      . folic acid (FOLVITE) 800 MCG tablet Take 400 mcg by mouth daily.      Marland Kitchen ipratropium (ATROVENT) 0.03 % nasal spray Place 2 sprays into the nose 2 (two) times daily.  30 mL  5  . MAGNESIUM PO Take 500 mg by mouth daily.      . montelukast (SINGULAIR) 10 MG tablet Take 1 tablet (10 mg total) by mouth at bedtime.  90 tablet  1  . Multiple Vitamin (MULTIVITAMIN) tablet Take 1 tablet by mouth daily.      . Probiotic Product (PROBIOTIC PO)  Take by mouth daily.       No current facility-administered medications for this visit.    Family History  Problem Relation Age of Onset  . Diabetes Mother 60  . Hypertension Mother   . Heart disease Father 70    AMI x 3  . Hypertension Father   . Cancer Paternal Grandfather     colon    ROS:  Pertinent items are noted in HPI.  Otherwise, a comprehensive ROS was negative.  Exam:   BP 112/70  Pulse 68  Resp 16  Ht 5\' 1"  (1.549 m)  Wt 232 lb (105.235 kg)  BMI 43.86 kg/m2  LMP 01/04/2013 Height: 5\' 1"  (154.9 cm)  Ht Readings from Last 3 Encounters:  01/10/13 5\' 1"  (1.549 m)  09/01/12 5' (1.524 m)  06/25/12 5' 0.75" (1.543 m)    General appearance: alert, cooperative and appears stated age Head: Normocephalic, without obvious abnormality, atraumatic Neck: no adenopathy, supple, symmetrical, trachea midline and thyroid normal to inspection and palpation Lungs: clear to auscultation bilaterally Breasts: normal appearance, no masses or tenderness, No nipple retraction or dimpling, No nipple discharge or bleeding, No axillary or supraclavicular adenopathy Heart: regular  rate and rhythm Abdomen: soft, non-tender; no masses,  no organomegaly Extremities: extremities normal, atraumatic, no cyanosis or edema Skin: Skin color, texture, turgor normal. No rashes or lesions Lymph nodes: Cervical, supraclavicular, and axillary nodes normal. No abnormal inguinal nodes palpated Neurologic: Grossly normal   Pelvic: External genitalia:  no lesions              Urethra:  normal appearing urethra with no masses, tenderness or lesions              Bartholin's and Skene's: normal                 Vagina: normal appearing vagina with normal color and discharge, no lesions, scant blood present              Cervix: normal, non tender              Pap taken: yes Bimanual Exam:  Uterus:  normal size, contour, position, consistency, mobility, non-tender and anteverted              Adnexa: normal  adnexa and no mass, fullness, tenderness               Rectovaginal: Confirms               Anus:  normal sphincter tone, no lesions  A:  Well Woman with normal exam  Menorrhagia desires cycle control with Nuvaring  Fasting labs  History of abnormal pap LSIL, 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 Patient inserted and removed Nuvaring with provider check without problems  Rx Nuvaring see order  Labs: CMP,Lipid Panel, TSH, Hgb A1c  Pap smear as per guidelines   pap smear taken today as follow up from abnormal if negative repeat in one year, if not per results  counseled on breast self exam, STD prevention, HIV risk factors and prevention, adequate intake of calcium and vitamin D, diet and exercise  return annually or prn  An After Visit Summary was printed and given to the patient.

## 2013-01-13 NOTE — Progress Notes (Signed)
Note reviewed, agree with plan.  Uilani Sanville, MD  

## 2013-01-15 ENCOUNTER — Ambulatory Visit: Payer: 59 | Admitting: Certified Nurse Midwife

## 2013-01-17 ENCOUNTER — Other Ambulatory Visit: Payer: Self-pay | Admitting: Certified Nurse Midwife

## 2013-01-17 DIAGNOSIS — IMO0002 Reserved for concepts with insufficient information to code with codable children: Secondary | ICD-10-CM

## 2013-01-17 LAB — IPS PAP TEST WITH REFLEX TO HPV

## 2013-01-21 ENCOUNTER — Telehealth: Payer: Self-pay | Admitting: Emergency Medicine

## 2013-01-21 NOTE — Telephone Encounter (Signed)
Message left to return call to Yumna Ebers at [phone].    

## 2013-01-21 NOTE — Telephone Encounter (Signed)
Message copied by [name] on Mon Jan 21, 2013  9:34 AM ------      Message from: Ashley Barnes      Created: Thu Jan 17, 2013 12:57 PM       Notify patient that pap smear is still abnormal, but less than before with + HPV. Needs colposcopy again as we discussed would need to occur if still abnormal      Order in ------

## 2013-01-21 NOTE — Telephone Encounter (Signed)
Spoke with patient and message from Ashley Barnes CNM given. Patient verbalized understanding and pre procedure instructions given.  Patient should start menses on 11/26 and begin to use Nuva Ring that day. Scheduled Colpo procedure for 12/3. Patient is agreeable.

## 2013-01-21 NOTE — Telephone Encounter (Signed)
Patient returned Tracy's call.

## 2013-01-28 ENCOUNTER — Ambulatory Visit: Payer: 59 | Admitting: Certified Nurse Midwife

## 2013-02-06 ENCOUNTER — Ambulatory Visit (INDEPENDENT_AMBULATORY_CARE_PROVIDER_SITE_OTHER): Payer: 59 | Admitting: Certified Nurse Midwife

## 2013-02-06 ENCOUNTER — Encounter: Payer: Self-pay | Admitting: Certified Nurse Midwife

## 2013-02-06 VITALS — BP 110/68 | HR 68 | Resp 16 | Ht 61.0 in | Wt 238.0 lb

## 2013-02-06 DIAGNOSIS — IMO0002 Reserved for concepts with insufficient information to code with codable children: Secondary | ICD-10-CM

## 2013-02-06 DIAGNOSIS — N870 Mild cervical dysplasia: Secondary | ICD-10-CM

## 2013-02-06 DIAGNOSIS — R6889 Other general symptoms and signs: Secondary | ICD-10-CM

## 2013-02-06 NOTE — Progress Notes (Signed)
Patient ID: Ashley Barnes, female   DOB: [birth year], 27 y.o.   MRN: [id number]  Chief Complaint  Patient presents with  . Colposcopy    HPI Ashley Barnes is a 27 y.o. female.  gopo here for colposcopy. Denies vaginal or pelvic pain. Patient also request wellness form completed for work regarding recent aex.  HPI  Indications: Pap smear on January 10, 2013 showed: ASCUS with POSITIVE high risk HPV. Previous colposcopy: CIN 1, in 01/23/12. Prior cervical treatment: no treatment. Previous history in 01/29/10 with colpo with CIN 1.  Past Medical History  Diagnosis Date  . Obesity   . Allergy   . Asthma   . Recurrent sinusitis   . Anemia   . Abnormal Pap smear     12-29-09 ASCUS+HPV, 01-04-12 LSIL, 01-10-13 ASCUS +HPV  . Migraines     no aura    Past Surgical History  Procedure Laterality Date  . Wisdom tooth extraction    . 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 Diabetes Mother 59  . Hypertension Mother   . Heart disease Father 56    AMI x 3  . Hypertension Father   . Cancer Paternal Grandfather     colon    Social History History  Substance Use Topics  . Smoking status: Never Smoker   . Smokeless tobacco: Never Used  . Alcohol Use: No    Allergies  Allergen Reactions  . Penicillins Anaphylaxis  . Corticosteroids Hives  . Zocor [Simvastatin - High Dose] Hives and Rash    Current Outpatient Prescriptions  Medication Sig Dispense Refill  . B Complex Vitamins (B COMPLEX PO) Take by mouth daily.      Marland Kitchen CALCIUM-MAGNESIUM-ZINC PO Take by mouth daily.      . Cholecalciferol (VITAMIN D PO) Take by mouth daily.      Marland Kitchen etonogestrel-ethinyl estradiol (NUVARING) 0.12-0.015 MG/24HR vaginal ring Place 1 each vaginally once. Insert vaginally and leave in place for 3 consecutive weeks, then remove for 1 week.  1 each  12  . folic acid (FOLVITE) 800 MCG tablet Take 400 mcg by mouth daily.      Marland Kitchen ipratropium (ATROVENT)  0.03 % nasal spray Place 2 sprays into the nose 2 (two) times daily.  30 mL  5  . MAGNESIUM PO Take 500 mg by mouth daily.      . montelukast (SINGULAIR) 10 MG tablet Take 1 tablet (10 mg total) by mouth at bedtime.  90 tablet  1  . Multiple Vitamin (MULTIVITAMIN) tablet Take 1 tablet by mouth daily.      . Probiotic Product (PROBIOTIC PO) Take by mouth daily.       No current facility-administered medications for this visit.    Review of Systems Review of Systems  Constitutional: Negative.   Genitourinary: Negative for vaginal bleeding, vaginal discharge and vaginal pain.    Blood pressure 110/68, pulse 68, resp. rate 16, height 5\' 1"  (1.549 m), weight 238 lb (107.956 kg), last menstrual period 01/30/2013.  Physical Exam Physical Exam  Constitutional: She is oriented to person, place, and time. She appears well-developed and well-nourished.  Genitourinary: Vagina normal. No vaginal discharge found.    Neurological: She is alert and oriented to person, place, and time.  Skin: Skin is warm and dry.  Psychiatric: She has a normal mood and affect. Judgment normal.   Nuvaring noted in placed, removed prior  to colposcopy  Data Reviewed Reviewed pap smear results with patient,  Assessment  Healthy female here for colposcopy due to ASCUS + HPVHR pap History of same with CIN1 with previous colpo for LSIL  Procedure Details  The risks and benefits of the procedure and Written informed consent obtained.  Speculum placed in vagina and excellent visualization of cervix achieved, cervix swabbed x 3 with saline solution and  with acetic acid solution. Cervix viewed with 3.75, 7.5,15 # and green filter with acetowhite lesion noted at 11 o'clock. Lugol's solution applied with non staining noted in same area. Biopsy taken at 11 o'clock. ECC obtained. Monsel's applied. No active bleeding noted on speculum removal. Patient tolerated procedure well. Nuvaring replaced in vagina.  Specimens:  2  Complications: none.     Plan   Wellness form completed given to patient. Handout on folic acid given to patient. Specimens labelled and sent to Pathology. Patient will be notified of results when reviewed.   Pathology report: Biopsy showed LSIL, mild dysplasia,CIN1 Ecc negative for dysplasia Patient to be notified and follow up pap schedule for one year Pap recall  02/08/13   [facility] 02/06/2013, 3:10 PM

## 2013-02-06 NOTE — Progress Notes (Signed)
Pap was 01-10-13 ASCUS +HPV. Previous colpo 2011 & 2013 CIN1. Pt did not take ibuprofen

## 2013-02-06 NOTE — Patient Instructions (Addendum)

## 2013-02-08 LAB — IPS CERVICAL/ECC/EMB/VULVAR/VAGINAL BIOPSY

## 2013-02-08 NOTE — Progress Notes (Signed)
Note reviewed, agree with plan.  Ashley Quesinberry, MD  

## 2013-02-18 ENCOUNTER — Telehealth: Payer: Self-pay | Admitting: *Deleted

## 2013-02-18 MED ORDER — NORETHIN ACE-ETH ESTRAD-FE 1.5-30 MG-MCG PO TABS: 1.0000 | ORAL_TABLET | Freq: Every day | ORAL | Status: DC

## 2013-02-18 NOTE — Telephone Encounter (Signed)
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

## 2013-02-18 NOTE — Telephone Encounter (Signed)
Patient notified of results as directed by Debbi.  Patient agrres to follow-up in one year. Has AEX sched for 01-16-14. 08 recall entered.  Patient started on Nuvaring and does not like all the increased vaginal discharge associated with it.  Requests to go back to [facility], liked previous low dose pill.  Per pre-EPIC chart, was on Loestrin FE1.5/30.  Will put chart in your office.  Advised patient we will check with Debbi and call her back.

## 2013-02-19 NOTE — Telephone Encounter (Signed)
Patient is asking when she needed to start the pill. She says her nuva ring is schedule to come out tomorrow.

## 2013-02-19 NOTE — Telephone Encounter (Signed)
Needs to start pill when period starts just this first pack

## 2013-02-19 NOTE — Telephone Encounter (Signed)
Call to patient, she states she received message from pharmacy that RX was ready. Advised of Debbi's instruction to begin pills on day 1 of this cycle.

## 2013-03-04 ENCOUNTER — Ambulatory Visit (INDEPENDENT_AMBULATORY_CARE_PROVIDER_SITE_OTHER): Payer: Managed Care, Other (non HMO) | Admitting: Obstetrics and Gynecology

## 2013-03-04 ENCOUNTER — Encounter: Payer: Self-pay | Admitting: Obstetrics and Gynecology

## 2013-03-04 ENCOUNTER — Telehealth: Payer: Self-pay | Admitting: Certified Nurse Midwife

## 2013-03-04 VITALS — BP 102/60 | HR 72 | Resp 16 | Ht 61.0 in | Wt 237.0 lb

## 2013-03-04 DIAGNOSIS — N921 Excessive and frequent menstruation with irregular cycle: Secondary | ICD-10-CM

## 2013-03-04 DIAGNOSIS — N920 Excessive and frequent menstruation with regular cycle: Secondary | ICD-10-CM

## 2013-03-04 NOTE — Telephone Encounter (Signed)
Pt says she is still bleeding after Debbi changed her birth control.

## 2013-03-04 NOTE — Telephone Encounter (Signed)
Spoke with patient, she states that she was on Nuvaring for 3 weeks and dc due to discharge and odor from Nuvaring. Then, on 02/19/13 patient states she started her period and then began taking Loestrin. She states that "I have been having a full blown period since". Changing pad, q 4 hours. She was not on any birth control for 1 year prior. States no missed pills and no concern for pregnancy. She is concerned that she has been having vaginal bleeding x 2 weeks. Advised it may be due to recent changes of birth control and that it can be monitored and and patient requests Office visit today with any provider to discuss as she states she is anemic and concerned.   Office visit scheduled for today with Dr. Edward Jolly. Patient is agreeable.   Routing to provider for final review. Patient agreeable to disposition. Will close encounter

## 2013-03-04 NOTE — Patient Instructions (Signed)
Take two active birth control pills a day for the next 5 days, skip the placebo pills in your current pack, and go directly into your next pack. You can have your cycle at the end of the next pack. Call if you do not have any improvement in your bleeding. Be patient while your body is adjusting to this new form of birth control!

## 2013-03-04 NOTE — Progress Notes (Signed)
Patient ID: Ashley Barnes, female   DOB: Oct 25, 1985, 27 y.o.   MRN: [id number]  GYNECOLOGY PROBLEM VISIT  PCP:   Referring provider:   HPI: 27 y.o.   Single  Hispanic  female   [id number] with Patient's last menstrual period was 02/19/2013.   here for vaginal bleeding.  Pad change every 3 - 4 hours with clotting and discomfort.    Started NuvaRing and patient had vaginal discharge and spotting so stopped it.  This was the first ring patient used.  Placed it properly at the time of menses. It was in for three fulll weeks.  Had a menstrual period started 02/19/13 and has not stopped since. Had a colposcopy on January 22, 2013 with Sara Chu.  Started the NuvaRing one week prior to colposcopy and it was removed temporarily for this procedure.  Prior to starting NuvaRing cycles were heavy and occurring every 21 days and lasting for up to seven days.   Started LoEstrin 1.5/30 on 02/19/13 and taking on time and daily.    No intercourse since July 2014.  URI symptoms currently. Congestion. Some dizziness upon sitting.   GYNECOLOGIC HISTORY: Patient's last menstrual period was 02/19/2013.  History of abnormal pap smear:  Yes.  Status post colposcopy 12/14-   Biopsy showed LGSIL.  Plan for repeat pap in one year.    OB History   Grav Para Term Preterm Abortions TAB SAB Ect Mult Living   0 0 0 0 0 0 0 0 0 0          Family History  Problem Relation Age of Onset  . Diabetes Mother 77  . Hypertension Mother   . Heart disease Father 38    AMI x 3  . Hypertension Father   . Cancer Paternal Grandfather     colon    Patient Active Problem List   Diagnosis Date Noted  . Asthma 12/23/2011  . AR (allergic rhinitis) 12/23/2011    Past Medical History  Diagnosis Date  . Obesity   . Allergy   . Asthma   . Recurrent sinusitis   . Anemia   . Abnormal Pap smear     12-29-09 ASCUS+HPV, 01-04-12 LSIL, 01-10-13 ASCUS +HPV  . Migraines     no aura    Past Surgical History   Procedure Laterality Date  . Wisdom tooth extraction    . Colposcopy      2011 & 2013 CIN1, 02-06-13     ALLERGIES: Penicillins; Corticosteroids; and Zocor  Current Outpatient Prescriptions  Medication Sig Dispense Refill  . B Complex Vitamins (B COMPLEX PO) Take by mouth daily.      Marland Kitchen CALCIUM-MAGNESIUM-ZINC PO Take by mouth daily.      . Cholecalciferol (VITAMIN D PO) Take by mouth daily.      Marland Kitchen ipratropium (ATROVENT) 0.03 % nasal spray Place 2 sprays into the nose 2 (two) times daily.  30 mL  5  . MAGNESIUM PO Take 500 mg by mouth daily.      . montelukast (SINGULAIR) 10 MG tablet Take 1 tablet (10 mg total) by mouth at bedtime.  90 tablet  1  . Multiple Vitamin (MULTIVITAMIN) tablet Take 1 tablet by mouth daily.      . norethindrone-ethinyl estradiol-iron (MICROGESTIN FE,GILDESS FE,LOESTRIN FE) 1.5-30 MG-MCG tablet Take 1 tablet by mouth daily.  1 Package  11  . Probiotic Product (PROBIOTIC PO) Take by mouth daily.      . folic acid (FOLVITE) 800 MCG tablet Take 400  mcg by mouth daily.       No current facility-administered medications for this visit.     ROS:  Pertinent items are noted in HPI.   PHYSICAL EXAMINATION:    BP 102/60  Pulse 72  Resp 16  Ht 5\' 1"  (1.549 m)  Wt 237 lb (107.502 kg)  BMI 44.80 kg/m2  LMP 02/19/2013   Wt Readings from Last 3 Encounters:  03/04/13 237 lb (107.502 kg)  02/06/13 238 lb (107.956 kg)  01/10/13 232 lb (105.235 kg)     Ht Readings from Last 3 Encounters:  03/04/13 5\' 1"  (1.549 m)  02/06/13 5\' 1"  (1.549 m)  01/10/13 5\' 1"  (1.549 m)    General appearance: alert, cooperative and appears stated age.  Obesity noted.   Pelvic: External genitalia:  no lesions              Urethra:  normal appearing urethra with no masses, tenderness or lesions              Bartholins and Skenes: normal                 Vagina: normal appearing vagina with normal color and discharge, no lesions.  Small amount of menstrual flow noted.                Cervix: normal appearance                 Bimanual Exam:  Uterus:  uterus is normal size, shape, consistency and nontender                                      Adnexa: normal adnexa in size, nontender and no masses                                      Rectovaginal: Confirms                                      Anus:  normal sphincter tone, no lesions  ASSESSMENT  Menometrorrhagia. New start of OCPs. History of LGSIL.  PLAN  Will doulbe up on LoEstrin 1.5/30 for 5 days, skip placebo pills, and finish the next pack and have cycle at the end of that pack. Reassurance given regarding 90 days of time being common for adjustment to a new pill. Check Hgb - 13.2. Ibuprofen or Aleve prn cramping. (States she can take this without problem.  Has asthma.) Return prn.  Pap in one year.    15 minutes face to face time of which over 50% was spent in counseling.   An After Visit Summary was printed and given to the patient.

## 2013-03-28 ENCOUNTER — Other Ambulatory Visit: Payer: Self-pay | Admitting: Orthopedic Surgery

## 2013-03-28 ENCOUNTER — Telehealth: Payer: Self-pay | Admitting: Certified Nurse Midwife

## 2013-03-28 MED ORDER — NORETHIN ACE-ETH ESTRAD-FE 1.5-30 MG-MCG PO TABS: 1.0000 | ORAL_TABLET | Freq: Every day | ORAL | Status: DC

## 2013-03-28 NOTE — Telephone Encounter (Signed)
Spoke with pt who got a letter stating she needs to start using [facility] for her OCP. Pt has 7 pills left.

## 2013-03-28 NOTE — Telephone Encounter (Signed)
Patient needs to change her birth control to a 90 day supply. Patient is not sure of the name of the prescription. [facility]. Please call patient let know when done.

## 2013-03-28 NOTE — Telephone Encounter (Signed)
[facility] to [facility] 3 packages.

## 2013-05-31 ENCOUNTER — Ambulatory Visit (INDEPENDENT_AMBULATORY_CARE_PROVIDER_SITE_OTHER): Payer: Managed Care, Other (non HMO) | Admitting: Physician Assistant

## 2013-05-31 VITALS — BP 112/64 | HR 90 | Temp 98.5°F | Resp 16 | Ht 61.0 in | Wt 244.0 lb

## 2013-05-31 DIAGNOSIS — J069 Acute upper respiratory infection, unspecified: Secondary | ICD-10-CM

## 2013-05-31 DIAGNOSIS — J45909 Unspecified asthma, uncomplicated: Secondary | ICD-10-CM

## 2013-05-31 DIAGNOSIS — B9789 Other viral agents as the cause of diseases classified elsewhere: Principal | ICD-10-CM

## 2013-05-31 MED ORDER — MUCINEX DM MAXIMUM STRENGTH 60-1200 MG PO TB12: 1.0000 | ORAL_TABLET | Freq: Two times a day (BID) | ORAL | Status: DC

## 2013-05-31 MED ORDER — HYDROCOD POLST-CHLORPHEN POLST 10-8 MG/5ML PO LQCR: 5.0000 mL | Freq: Two times a day (BID) | ORAL | Status: AC

## 2013-05-31 MED ORDER — ALBUTEROL SULFATE HFA 108 (90 BASE) MCG/ACT IN AERS: 2.0000 | INHALATION_SPRAY | Freq: Four times a day (QID) | RESPIRATORY_TRACT | Status: DC | PRN

## 2013-05-31 NOTE — Progress Notes (Signed)
   Subjective:    Patient ID: Ashley Barnes, female    DOB: 1985-08-25, 28 y.o.   MRN: [id number]  HPI Pt presents to clinic with 3 day h/o cold symptoms that started with a sore throat that has improved but now she has nasal congestion but no rhinorrhea and she does PND.  She has a cough that is mostly dry.  She has h/o asthma but is not having trouble with her breathing unless she is in a coughing spell.  She does not routinely take her singulair or atrovent.  OTC meds - advil cold and sinus, benadryl Sick contacts - work  Review of Systems  Constitutional: Negative for fever and chills.  HENT: Positive for congestion, postnasal drip, rhinorrhea and sore throat (raw and now it is better).   Respiratory: Positive for cough (white).   Musculoskeletal: Negative for myalgias.  Allergic/Immunologic: Positive for environmental allergies.  Neurological: Negative for headaches.       Objective:   Physical Exam  Vitals reviewed. Constitutional: She is oriented to person, place, and time. She appears well-developed and well-nourished.  HENT:  Head: Normocephalic and atraumatic.  Right Ear: Hearing, tympanic membrane, external ear and ear canal normal.  Left Ear: Hearing, tympanic membrane, external ear and ear canal normal.  Nose: Mucosal edema (red) present.  Mouth/Throat: Uvula is midline, oropharynx is clear and moist and mucous membranes are normal.  Eyes: Conjunctivae are normal.  Neck: Normal range of motion.  Cardiovascular: Normal rate, regular rhythm and normal heart sounds.   No murmur heard. Pulmonary/Chest: Effort normal and breath sounds normal. She has no wheezes.  Neurological: She is alert and oriented to person, place, and time.  Skin: Skin is warm and dry.  Psychiatric: She has a normal mood and affect. Her behavior is normal. Thought content normal.      Assessment & Plan:  Viral URI with cough - Plan: Dextromethorphan-Guaifenesin (MUCINEX DM MAXIMUM STRENGTH)  60-1200 MG TB12, chlorpheniramine-HYDROcodone (TUSSIONEX PENNKINETIC ER) 10-8 MG/5ML LQCR  Extrinsic asthma, unspecified - Plan: albuterol (PROVENTIL HFA;VENTOLIN HFA) 108 (90 BASE) MCG/ACT inhaler  Symptomatic care was discussed with patient and she will try that for a week and then if she is no better she will call with her symptoms and she may need an abx at that time.  She will restart her Atrovent and Singulair.  Benny LennertSarah Savannah Erbe PA-C  [facility] 05/31/2013 6:33 PM

## 2013-06-05 ENCOUNTER — Ambulatory Visit (INDEPENDENT_AMBULATORY_CARE_PROVIDER_SITE_OTHER): Payer: Managed Care, Other (non HMO) | Admitting: Family Medicine

## 2013-06-05 ENCOUNTER — Telehealth: Payer: Self-pay

## 2013-06-05 ENCOUNTER — Encounter: Payer: Self-pay | Admitting: Physician Assistant

## 2013-06-05 DIAGNOSIS — J309 Allergic rhinitis, unspecified: Secondary | ICD-10-CM

## 2013-06-05 DIAGNOSIS — H9209 Otalgia, unspecified ear: Secondary | ICD-10-CM

## 2013-06-05 DIAGNOSIS — H6590 Unspecified nonsuppurative otitis media, unspecified ear: Secondary | ICD-10-CM

## 2013-06-05 MED ORDER — IPRATROPIUM BROMIDE 0.06 % NA SOLN: 2.0000 | Freq: Four times a day (QID) | NASAL | Status: DC

## 2013-06-05 MED ORDER — DOXYCYCLINE HYCLATE 100 MG PO TABS
100.0000 mg | ORAL_TABLET | Freq: Two times a day (BID) | ORAL | Status: DC
Start: 2013-06-05 — End: 2013-07-07

## 2013-06-05 NOTE — Progress Notes (Signed)
Subjective:    Patient ID: Ashley Barnes, female    DOB: Apr 30, 1985, 28 y.o.   MRN: [id number]  HPI Ashley Barnes is a 28 y.o. female   Seen by Maralyn Sago on 05/31/12, 3 days of cough st, congestion and more cough.  Treated with otc sx care, and cough syrup, and albuterol inhaler - using this about 3-4 times per day - usually when coming in from outside. No doe or rest. No fever, but occasional chills. Now more R ear pressure, feels blocked past 2 days, pain in ear. No ear discharge. See phone notes - doxycycline sent in, but did not know it was there.   Hx of ear infections - this time last year.   No recent swimming.   No otc meds for ears.    Patient Active Problem List   Diagnosis Date Noted  . Asthma 12/23/2011  . AR (allergic rhinitis) 12/23/2011   Past Medical History  Diagnosis Date  . Obesity   . Allergy   . Asthma   . Recurrent sinusitis   . Anemia   . Abnormal Pap smear     12-29-09 ASCUS+HPV, 01-04-12 LSIL, 01-10-13 ASCUS +HPV  . Migraines     no aura   Past Surgical History  Procedure Laterality Date  . Wisdom tooth extraction    . Colposcopy      2011 & 2013 CIN1, 02-06-13    Allergies  Allergen Reactions  . Penicillins Anaphylaxis  . Corticosteroids Hives  . Zocor [Simvastatin - High Dose] Hives and Rash   Prior to Admission medications   Medication Sig Start Date End Date Taking? Authorizing Provider  albuterol (PROVENTIL HFA;VENTOLIN HFA) 108 (90 BASE) MCG/ACT inhaler Inhale 2 puffs into the lungs every 6 (six) hours as needed for wheezing or shortness of breath. 05/31/13  Yes Morrell Riddle, PA-C  B Complex Vitamins (B COMPLEX PO) Take by mouth daily.   Yes Historical Provider, MD  CALCIUM-MAGNESIUM-ZINC PO Take by mouth daily.   Yes Historical Provider, MD  chlorpheniramine-HYDROcodone (TUSSIONEX PENNKINETIC ER) 10-8 MG/5ML LQCR Take 5 mLs by mouth every 12 (twelve) hours. 05/31/13 06/10/13 Yes Sarah Harvie Bridge, PA-C  Cholecalciferol (VITAMIN D PO) Take by mouth  daily.   Yes Historical Provider, MD  Dextromethorphan-Guaifenesin (MUCINEX DM MAXIMUM STRENGTH) 60-1200 MG TB12 Take 1 tablet by mouth 2 (two) times daily. 05/31/13  Yes Morrell Riddle, PA-C  doxycycline (VIBRA-TABS) 100 MG tablet Take 1 tablet (100 mg total) by mouth 2 (two) times daily. 06/05/13  Yes Morrell Riddle, PA-C  folic acid (FOLVITE) 800 MCG tablet Take 400 mcg by mouth daily.   Yes Historical Provider, MD  ipratropium (ATROVENT) 0.03 % nasal spray Place 2 sprays into the nose 2 (two) times daily. 12/23/11  Yes Chelle S Jeffery, PA-C  MAGNESIUM PO Take 500 mg by mouth daily.   Yes Historical Provider, MD  montelukast (SINGULAIR) 10 MG tablet Take 1 tablet (10 mg total) by mouth at bedtime. 12/26/11  Yes Chelle S Jeffery, PA-C  Multiple Vitamin (MULTIVITAMIN) tablet Take 1 tablet by mouth daily.   Yes Historical Provider, MD  norethindrone-ethinyl estradiol-iron (MICROGESTIN FE,GILDESS FE,LOESTRIN FE) 1.5-30 MG-MCG tablet Take 1 tablet by mouth daily. 03/28/13  Yes Verner Chol, CNM  Probiotic Product (PROBIOTIC PO) Take by mouth daily.   Yes Historical Provider, MD   History   Social History  . Marital Status: Single    Spouse Name: n/a    Number of Children: 0  .  Years of Education: 14   Occupational History  . Mortgage Specialist [employer]   Social History Main Topics  . Smoking status: Never Smoker   . Smokeless tobacco: Never Used  . Alcohol Use: No  . Drug Use: No  . Sexual Activity: Not Currently    Partners: Male    Birth Control/ Protection: Abstinence   Other Topics Concern  . Not on file   Social History Narrative   Lives alone.       Review of Systems  Constitutional: Negative for fever.  HENT: Positive for ear pain.   Respiratory: Positive for cough and wheezing. Negative for shortness of breath.   Skin: Negative for rash.       Objective:   Physical Exam  Vitals reviewed. Constitutional: She is oriented to person, place, and time. She  appears well-developed and well-nourished. No distress.  HENT:  Head: Normocephalic and atraumatic.  Right Ear: Hearing, external ear and ear canal normal. Tympanic membrane is not injected, not erythematous and not bulging. A middle ear effusion is present.  Left Ear: Hearing, external ear and ear canal normal. Tympanic membrane is not injected, not erythematous and not bulging. A middle ear effusion is present.  Nose: Right sinus exhibits maxillary sinus tenderness and frontal sinus tenderness. Left sinus exhibits maxillary sinus tenderness and frontal sinus tenderness.  Mouth/Throat: Oropharynx is clear and moist. No oropharyngeal exudate.  Eyes: Conjunctivae and EOM are normal. Pupils are equal, round, and reactive to light.  Neck: Trachea normal. No thyromegaly present.  Cardiovascular: Normal rate, regular rhythm, normal heart sounds and intact distal pulses.   No murmur heard. Pulmonary/Chest: Effort normal and breath sounds normal. No respiratory distress. She has no wheezes. She has no rhonchi.  Neurological: She is alert and oriented to person, place, and time.  Skin: Skin is warm and dry. No rash noted.  Psychiatric: She has a normal mood and affect. Her behavior is normal.       Assessment & Plan:   Ashley KohlGissel Barnes is a 28 y.o. female Serous otitis media - Plan: ipratropium (ATROVENT) 0.06 % nasal spray  Otalgia  Allergic rhinitis - Plan: ipratropium (ATROVENT) 0.06 % nasal spray  Suspected initial viral URI with RAD, and now secondary serous otitis vs otitis media with effusion, but no TM erythema and canals clear at this point. Trial of short term Afrin, atrovent NS as below, sx care. Has doxy at pharmacy if not improving in few days, but does not appear to be infected at present. rtc precautions given.  Meds ordered this encounter  Medications  . ipratropium (ATROVENT) 0.06 % nasal spray    Sig: Place 2 sprays into the nose 4 (four) times daily.    Dispense:  15 mL     Refill:  1   Patient Instructions  Afrin nasal spray up to 3 days only. If not improving in next 1-2 days - can start antibiotic (at your pharmacy).  Return to the clinic or go to the nearest emergency room if any of your symptoms worsen or new symptoms occur.   Serous Otitis Media  Serous otitis media is fluid in the middle ear space. This space contains the bones for hearing and air. Air in the middle ear space helps to transmit sound.  The air gets there through the eustachian tube. This tube goes from the back of the nose (nasopharynx) to the middle ear space. It keeps the pressure in the middle ear the same as the  outside world. It also helps to drain fluid from the middle ear space. CAUSES  Serous otitis media occurs when the eustachian tube gets blocked. Blockage can come from:  Ear infections.  Colds and other upper respiratory infections.  Allergies.  Irritants such as cigarette smoke.  Sudden changes in air pressure (such as descending in an airplane).  Enlarged adenoids.  A mass in the nasopharynx. During colds and upper respiratory infections, the middle ear space can become temporarily filled with fluid. This can happen after an ear infection also. Once the infection clears, the fluid will generally drain out of the ear through the eustachian tube. If it does not, then serous otitis media occurs. SIGNS AND SYMPTOMS   Hearing loss.  A feeling of fullness in the ear, without pain.  Young children may not show any symptoms but may show slight behavioral changes, such as agitation, ear pulling, or crying. DIAGNOSIS  Serous otitis media is diagnosed by an ear exam. Tests may be done to check on the movement of the eardrum. Hearing exams may also be done. TREATMENT  The fluid most often goes away without treatment. If allergy is the cause, allergy treatment may be helpful. Fluid that persists for several months may require minor surgery. A small tube is placed in the  eardrum to:  Drain the fluid.  Restore the air in the middle ear space. In certain situations, antibiotics are used to avoid surgery. Surgery may be done to remove enlarged adenoids (if this is the cause). HOME CARE INSTRUCTIONS   Keep children away from tobacco smoke.  Be sure to keep any follow-up appointments. SEEK MEDICAL CARE IF:   Your hearing is not better in 3 months.  Your hearing is worse.  You have ear pain.  You have drainage from the ear.  You have dizziness.  You have serous otitis media only in one ear or have any bleeding from your nose (epistaxis).  You notice a lump on your neck. MAKE SURE YOU:  Understand these instructions.   Will watch your condition.   Will get help right away if you are not doing well or get worse.  Document Released: 05/14/2003 Document Revised: 10/24/2012 Document Reviewed: 09/18/2012 [facility] Patient Information 2014 [place], [place].

## 2013-06-05 NOTE — Patient Instructions (Signed)
Afrin nasal spray up to 3 days only. If not improving in next 1-2 days - can start antibiotic (at your pharmacy).  Return to the clinic or go to the nearest emergency room if any of your symptoms worsen or new symptoms occur.   Serous Otitis Media  Serous otitis media is fluid in the middle ear space. This space contains the bones for hearing and air. Air in the middle ear space helps to transmit sound.  The air gets there through the eustachian tube. This tube goes from the back of the nose (nasopharynx) to the middle ear space. It keeps the pressure in the middle ear the same as the outside world. It also helps to drain fluid from the middle ear space. CAUSES  Serous otitis media occurs when the eustachian tube gets blocked. Blockage can come from:  Ear infections.  Colds and other upper respiratory infections.  Allergies.  Irritants such as cigarette smoke.  Sudden changes in air pressure (such as descending in an airplane).  Enlarged adenoids.  A mass in the nasopharynx. During colds and upper respiratory infections, the middle ear space can become temporarily filled with fluid. This can happen after an ear infection also. Once the infection clears, the fluid will generally drain out of the ear through the eustachian tube. If it does not, then serous otitis media occurs. SIGNS AND SYMPTOMS   Hearing loss.  A feeling of fullness in the ear, without pain.  Young children may not show any symptoms but may show slight behavioral changes, such as agitation, ear pulling, or crying. DIAGNOSIS  Serous otitis media is diagnosed by an ear exam. Tests may be done to check on the movement of the eardrum. Hearing exams may also be done. TREATMENT  The fluid most often goes away without treatment. If allergy is the cause, allergy treatment may be helpful. Fluid that persists for several months may require minor surgery. A small tube is placed in the eardrum to:  Drain the fluid.  Restore  the air in the middle ear space. In certain situations, antibiotics are used to avoid surgery. Surgery may be done to remove enlarged adenoids (if this is the cause). HOME CARE INSTRUCTIONS   Keep children away from tobacco smoke.  Be sure to keep any follow-up appointments. SEEK MEDICAL CARE IF:   Your hearing is not better in 3 months.  Your hearing is worse.  You have ear pain.  You have drainage from the ear.  You have dizziness.  You have serous otitis media only in one ear or have any bleeding from your nose (epistaxis).  You notice a lump on your neck. MAKE SURE YOU:  Understand these instructions.   Will watch your condition.   Will get help right away if you are not doing well or get worse.  Document Released: 05/14/2003 Document Revised: 10/24/2012 Document Reviewed: 09/18/2012 [facility] Patient Information 2014 [place].

## 2013-06-05 NOTE — Telephone Encounter (Signed)
PT STATES SHE WAS SEEN FOR A SINUS INFECTION AND TOLD IF NO BETTER, TO GIVE US A CALL AND SHE WILL CALL IN AN ANTIBIOTIC FOR HER. PLEASE CALL [phone]    [facility] [address]

## 2013-06-05 NOTE — Telephone Encounter (Signed)
Abx sent to pharmacy

## 2013-06-05 NOTE — Telephone Encounter (Signed)
Pt notified that an rx was sent in but is here to be seen

## 2013-06-05 NOTE — Telephone Encounter (Signed)
Spoke with patient and her R ear is now clogged, still having cough, has sinus congestion, sinus pain/pressre, mucous has changed color from clear to yellow/green since Sunday. Please advise

## 2013-07-07 ENCOUNTER — Emergency Department (HOSPITAL_COMMUNITY)
Admission: EM | Admit: 2013-07-07 | Discharge: 2013-07-07 | Disposition: A | Discharge status: Home / Self Care | Payer: Managed Care, Other (non HMO) | Attending: Emergency Medicine | Admitting: Emergency Medicine

## 2013-07-07 ENCOUNTER — Emergency Department (HOSPITAL_COMMUNITY): Payer: Managed Care, Other (non HMO)

## 2013-07-07 ENCOUNTER — Encounter (HOSPITAL_COMMUNITY): Payer: Self-pay | Admitting: Emergency Medicine

## 2013-07-07 DIAGNOSIS — Z79899 Other long term (current) drug therapy: Secondary | ICD-10-CM | POA: Insufficient documentation

## 2013-07-07 DIAGNOSIS — Z8679 Personal history of other diseases of the circulatory system: Secondary | ICD-10-CM | POA: Insufficient documentation

## 2013-07-07 DIAGNOSIS — J45909 Unspecified asthma, uncomplicated: Secondary | ICD-10-CM | POA: Insufficient documentation

## 2013-07-07 DIAGNOSIS — R042 Hemoptysis: Secondary | ICD-10-CM | POA: Insufficient documentation

## 2013-07-07 DIAGNOSIS — Z880 Allergy status to penicillin: Secondary | ICD-10-CM | POA: Insufficient documentation

## 2013-07-07 DIAGNOSIS — D649 Anemia, unspecified: Secondary | ICD-10-CM | POA: Insufficient documentation

## 2013-07-07 DIAGNOSIS — E669 Obesity, unspecified: Secondary | ICD-10-CM | POA: Insufficient documentation

## 2013-07-07 DIAGNOSIS — J329 Chronic sinusitis, unspecified: Secondary | ICD-10-CM

## 2013-07-07 LAB — RAPID STREP SCREEN (MED CTR MEBANE ONLY): STREPTOCOCCUS, GROUP A SCREEN (DIRECT): NEGATIVE

## 2013-07-07 MED ORDER — DOXYCYCLINE HYCLATE 100 MG PO CAPS: 100.0000 mg | ORAL_CAPSULE | Freq: Two times a day (BID) | ORAL | Status: DC

## 2013-07-07 NOTE — ED Notes (Signed)
Pt states that she has been having URI sx x 3 wks.  Coughing up green phlegm.  States that she has been coughing up small amounts of blood and attributes that to her throat being sore.  Also c/o night sweats.

## 2013-07-07 NOTE — Discharge Instructions (Signed)
Take antibiotic as prescribed.  Take tylenol or ibuprofen as needed for sore throat and sinus pain.  You can also try sudafed.  Get rest and drink plenty of fluids.  Follow up with your primary care doctor.   You may return to the ER if symptoms worsen or you have any other concerns.

## 2013-07-07 NOTE — ED Provider Notes (Signed)
CSN: [id number]     Arrival date & time 07/07/13  1156 History   First MD Initiated Contact with Patient 07/07/13 1254     Chief Complaint  Patient presents with  . Cough  . Fever  . Hemoptysis     (Consider location/radiation/quality/duration/timing/severity/associated sxs/prior Treatment) HPI History provided by pt.   Pt had a sinus infection 2-3 weeks ago.  Sinus pressure has improved but has persistent nasal congestion and cough.  Cough is now productive of blood-streaked sputum and associated w/ intermittent fever, max temp 102.2, central chest pain and SOB in supine position.  Denies LE edema/pain.  Is on OCPs but otherwise no RF for PE and denies LE edema/pain.  Past Medical History  Diagnosis Date  . Obesity   . Allergy   . Asthma   . Recurrent sinusitis   . Anemia   . Abnormal Pap smear     12-29-09 ASCUS+HPV, 01-04-12 LSIL, 01-10-13 ASCUS +HPV  . Migraines     no aura   Past Surgical History  Procedure Laterality Date  . Wisdom tooth extraction    . 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 Diabetes Mother 2255  . Hypertension Mother   . Heart disease Father 5234    AMI x 3  . Hypertension Father   . Cancer Paternal Grandfather     colon   History  Substance Use Topics  . Smoking status: Never Smoker   . Smokeless tobacco: Never Used  . Alcohol Use: No   OB History   Grav Para Term Preterm Abortions TAB SAB Ect Mult Living   0 0 0 0 0 0 0 0 0 0      Review of Systems  All other systems reviewed and are negative.     Allergies  Penicillins; Corticosteroids; and Zocor  Home Medications   Prior to Admission medications   Medication Sig Start Date End Date Taking? Authorizing Provider  albuterol (PROVENTIL HFA;VENTOLIN HFA) 108 (90 BASE) MCG/ACT inhaler Inhale 2 puffs into the lungs every 6 (six) hours as needed for wheezing or shortness of breath. 05/31/13  Yes Morrell RiddleSarah L Weber, PA-C  B Complex-C  (B-COMPLEX WITH VITAMIN C) tablet Take 1 tablet by mouth every morning.   Yes Historical Provider, MD  CALCIUM-MAG-VIT C-VIT D PO Take 1 tablet by mouth every morning.   Yes Historical Provider, MD  cholecalciferol (VITAMIN D) 1000 UNITS tablet Take 1,000 Units by mouth every morning.   Yes Historical Provider, MD  folic acid (FOLVITE) 800 MCG tablet Take 400 mcg by mouth every morning.   Yes Historical Provider, MD  ipratropium (ATROVENT) 0.06 % nasal spray Place 2 sprays into both nostrils 4 (four) times daily as needed for rhinitis.   Yes Historical Provider, MD  montelukast (SINGULAIR) 10 MG tablet Take 10 mg by mouth every morning.   Yes Historical Provider, MD  Multiple Vitamin (MULTIVITAMIN) tablet Take 1 tablet by mouth every morning.    Yes Historical Provider, MD  norethindrone-ethinyl estradiol-iron (MICROGESTIN FE,GILDESS FE,LOESTRIN FE) 1.5-30 MG-MCG tablet Take 1 tablet by mouth every morning.   Yes Historical Provider, MD  Probiotic Product (PROBIOTIC PO) Take 1 capsule by mouth every morning.    Yes Historical Provider, MD   BP 151/85  Pulse 113  Temp(Src) 99.2 F (37.3 C) (Oral)  Ht 5\' 1"  (1.549 m)  Wt 240 lb (108.863 kg)  BMI 45.37 kg/m2  SpO2 99%  LMP 06/30/2013 Physical Exam  Nursing note and vitals reviewed. Constitutional: She is oriented to person, place, and time. She appears well-developed and well-nourished. No distress.  HENT:  Head: Normocephalic and atraumatic.  Diffuse sinus ttp.  Injection mid-line posterior pharynx.  No tonillar edema or exudate.   Eyes:  Normal appearance  Neck: Normal range of motion. Neck supple.  Cardiovascular: Normal rate and regular rhythm.   Pulmonary/Chest: Effort normal and breath sounds normal. No respiratory distress.  Musculoskeletal: Normal range of motion.  No LE edema or calf ttp  Lymphadenopathy:    She has no cervical adenopathy.  Neurological: She is alert and oriented to person, place, and time.  Skin: Skin is  warm and dry. No rash noted.  Psychiatric: She has a normal mood and affect. Her behavior is normal.    ED Course  Procedures (including critical care time) Labs Review Labs Reviewed  RAPID STREP SCREEN  CULTURE, GROUP A STREP    Imaging Review Dg Chest 2 View  07/07/2013   CLINICAL DATA:  Cough and fever.  EXAM: CHEST  2 VIEW  COMPARISON:  None.  FINDINGS: The cardiomediastinal silhouette is unremarkable.  There is no evidence of focal airspace disease, pulmonary edema, suspicious pulmonary nodule/mass, pleural effusion, or pneumothorax. No acute bony abnormalities are identified.  IMPRESSION: No active cardiopulmonary disease.   Electronically Signed   By: Laveda AbbeJeff  Hu M.D.   On: 07/07/2013 13:41     EKG Interpretation None      MDM   Final diagnoses:  Sinusitis    27yo healthy F w/ presents w/ hemoptysis.  Has had a cough for 2-3 weeks and now productive and associated w/ CP, SOB and fever.  Only RF for PE is OCPs.  On exam, afebrile, non-toxic appearing, injection of posterior pharynx, diffuse sinus ttp, no respiratory distress, diminished breath sounds R lung base, tachycardic, no LE edema/ttp.  CXR neg.  Rapid strep screen pending.   2:22 PM   Strep screen neg.  Will treat patient for sinusitis because though sinus pressure has improved, she is still diffusely tender on exam and nasal congestion has persisted.  Brother being treated for sinus infection, diagnosed via CT head, in ER today as well.  Recommended f/u with PCP. Return precautions discussed. 2:53 PM    Otilio Miuatherine E Hadas Jessop, PA-C 07/07/13 1544

## 2013-07-08 NOTE — ED Provider Notes (Signed)
Medical screening examination/treatment/procedure(s) were performed by non-physician practitioner and as supervising physician I was immediately available for consultation/collaboration.   EKG Interpretation None        Theresa Dohrman J. Camarion Weier, MD 07/08/13 0806 

## 2013-07-09 LAB — CULTURE, GROUP A STREP

## 2013-07-10 ENCOUNTER — Ambulatory Visit (INDEPENDENT_AMBULATORY_CARE_PROVIDER_SITE_OTHER): Payer: Managed Care, Other (non HMO) | Admitting: Family Medicine

## 2013-07-10 VITALS — BP 108/68 | HR 67 | Temp 98.7°F | Resp 16 | Ht 60.0 in | Wt 245.6 lb

## 2013-07-10 DIAGNOSIS — R059 Cough, unspecified: Secondary | ICD-10-CM

## 2013-07-10 DIAGNOSIS — B9689 Other specified bacterial agents as the cause of diseases classified elsewhere: Secondary | ICD-10-CM

## 2013-07-10 DIAGNOSIS — N898 Other specified noninflammatory disorders of vagina: Secondary | ICD-10-CM

## 2013-07-10 DIAGNOSIS — R05 Cough: Secondary | ICD-10-CM

## 2013-07-10 DIAGNOSIS — N760 Acute vaginitis: Secondary | ICD-10-CM

## 2013-07-10 DIAGNOSIS — H6590 Unspecified nonsuppurative otitis media, unspecified ear: Secondary | ICD-10-CM

## 2013-07-10 LAB — POCT WET PREP WITH KOH
KOH Prep POC: NEGATIVE
RBC Wet Prep HPF POC: NEGATIVE
Trichomonas, UA: NEGATIVE
Yeast Wet Prep HPF POC: NEGATIVE

## 2013-07-10 MED ORDER — METRONIDAZOLE 500 MG PO TABS: 500.0000 mg | ORAL_TABLET | Freq: Two times a day (BID) | ORAL | Status: DC

## 2013-07-10 NOTE — Patient Instructions (Signed)
Start flagyl after antibiotic for cough/fever.  Zyrtec and astelin spray for allergies.  Return to the clinic or go to the nearest emergency room if any of your symptoms worsen or new symptoms occur. Bacterial Vaginosis Bacterial vaginosis is a vaginal infection that occurs when the normal balance of bacteria in the vagina is disrupted. It results from an overgrowth of certain bacteria. This is the most common vaginal infection in women of childbearing age. Treatment is important to prevent complications, especially in pregnant women, as it can cause a premature delivery. CAUSES  Bacterial vaginosis is caused by an increase in harmful bacteria that are normally present in smaller amounts in the vagina. Several different kinds of bacteria can cause bacterial vaginosis. However, the reason that the condition develops is not fully understood. RISK FACTORS Certain activities or behaviors can put you at an increased risk of developing bacterial vaginosis, including:  Having a new sex partner or multiple sex partners.  Douching.  Using an intrauterine device (IUD) for contraception. Women do not get bacterial vaginosis from toilet seats, bedding, swimming pools, or contact with objects around them. SIGNS AND SYMPTOMS  Some women with bacterial vaginosis have no signs or symptoms. Common symptoms include:  Grey vaginal discharge.  A fishlike odor with discharge, especially after sexual intercourse.  Itching or burning of the vagina and vulva.  Burning or pain with urination. DIAGNOSIS  Your health care provider will take a medical history and examine the vagina for signs of bacterial vaginosis. A sample of vaginal fluid may be taken. Your health care provider will look at this sample under a microscope to check for bacteria and abnormal cells. A vaginal pH test may also be done.  TREATMENT  Bacterial vaginosis may be treated with antibiotic medicines. These may be given in the form of a pill or  a vaginal cream. A second round of antibiotics may be prescribed if the condition comes back after treatment.  HOME CARE INSTRUCTIONS   Only take over-the-counter or prescription medicines as directed by your health care provider.  If antibiotic medicine was prescribed, take it as directed. Make sure you finish it even if you start to feel better.  Do not have sex until treatment is completed.  Tell all sexual partners that you have a vaginal infection. They should see their health care provider and be treated if they have problems, such as a mild rash or itching.  Practice safe sex by using condoms and only having one sex partner. SEEK MEDICAL CARE IF:   Your symptoms are not improving after 3 days of treatment.  You have increased discharge or pain.  You have a fever. MAKE SURE YOU:   Understand these instructions.  Will watch your condition.  Will get help right away if you are not doing well or get worse. FOR MORE INFORMATION  Centers for Disease Control and Prevention, Division of STD Prevention: SolutionApps.co.zawww.cdc.gov/std American Sexual Health Association (ASHA): www.ashastd.org  Document Released: 02/21/2005 Document Revised: 12/12/2012 Document Reviewed: 10/03/2012 [facility] Patient Information 2014 [place], [place].

## 2013-07-10 NOTE — Progress Notes (Addendum)
Subjective:    Patient ID: Ashley Barnes, female    DOB: 1985/06/21, 28 y.o.   MRN: [id number] This chart was scribed for Meredith StaggersJeffrey Deshanta Lady, MD by Evon Slackerrance Branch, ED Scribe. This Patient was seen in room 13 and the patients care was started at 7:46 PM  HPI Ashley Barnes is a 28 y.o. female Pt she states she recently had a fever onset Friday. States in the ER her fever reached 102 and 99 degrees farenheit. She states that her fever has been controlled since sunday. In the ER had several test which included chest X-ray, and rapid strep test. Test results showed negative for strep throat and pneumonia. she states the different  antibiotics she has been receiving may have given her an infection. She states that her productive cough was getting worse. She states that started the prescribed doxycycline for her cough symptoms. States that her symptoms improved with medications. She states she had associated diaphoresis, productive cough of sputum and blood, sinus pressure, congestion, and shortness of breath. Denies any use of nasal spray since Friday.   Vaginal discharge onset Sunday. Denies having any associated pain or related symptoms.     Patient Active Problem List   Diagnosis Date Noted  . Asthma 12/23/2011  . AR (allergic rhinitis) 12/23/2011   Past Medical History  Diagnosis Date  . Obesity   . Allergy   . Asthma   . Recurrent sinusitis   . Anemia   . Abnormal Pap smear     12-29-09 ASCUS+HPV, 01-04-12 LSIL, 01-10-13 ASCUS +HPV  . Migraines     no aura   Past Surgical History  Procedure Laterality Date  . Wisdom tooth extraction    . Colposcopy      20 11 & 2013 CIN1, 02-06-13    Prior to Admission medications   Medication Sig Start Date End Date Taking? Authorizing Provider  albuterol (PROVENTIL HFA;VENTOLIN HFA) 108 (90 BASE) MCG/ACT inhaler Inhale 2 puffs into the lungs every 6 (six) hours as needed for wheezing or shortness of breath. 05/31/13  Yes Morrell RiddleSarah L Weber, PA-C  B  Complex-C (B-COMPLEX WITH VITAMIN C) tablet Take 1 tablet by mouth every morning.   Yes Historical Provider, MD  CALCIUM-MAG-VIT C-VIT D PO Take 1 tablet by mouth every morning.   Yes Historical Provider, MD  cholecalciferol (VITAMIN D) 1000 UNITS tablet Take 1,000 Units by mouth every morning.   Yes Historical Provider, MD  doxycycline (VIBRAMYCIN) 100 MG capsule Take 1 capsule (100 mg total) by mouth 2 (two) times daily. 07/07/13  Yes Catherine E Schinlever, PA-C  folic acid (FOLVITE) 800 MCG tablet Take 400 mcg by mouth every morning.   Yes Historical Provider, MD  ipratropium (ATROVENT) 0.06 % nasal spray Place 2 sprays into both nostrils 4 (four) times daily as needed for rhinitis.   Yes Historical Provider, MD  montelukast (SINGULAIR) 10 MG tablet Take 10 mg by mouth every morning.   Yes Historical Provider, MD  Multiple Vitamin (MULTIVITAMIN) tablet Take 1 tablet by mouth every morning.    Yes Historical Provider, MD  norethindrone-ethinyl estradiol-iron (MICROGESTIN FE,GILDESS FE,LOESTRIN FE) 1.5-30 MG-MCG tablet Take 1 tablet by mouth every morning.   Yes Historical Provider, MD  Probiotic Product (PROBIOTIC PO) Take 1 capsule by mouth every morning.    Yes Historical Provider, MD   Allergies  Allergen Reactions  . Penicillins Anaphylaxis  . Corticosteroids Hives  . Zocor [Simvastatin - High Dose] Hives and Rash   History   Social History  .  Marital Status: Single    Spouse Name: n/a    Number of Children: 0  . Years of Education: 14   Occupational History  . Mortgage Specialist [employer]   Social History Main Topics  . Smoking status: Never Smoker   . Smokeless tobacco: Never Used  . Alcohol Use: No  . Drug Use: No  . Sexual Activity: Not Currently    Partners: Male    Birth Control/ Protection: Abstinence   Other Topics Concern  . Not on file   Social History Narrative   Lives alone.   Review of Systems  Constitutional: Positive for diaphoresis. Negative for  fever.  HENT: Positive for congestion and sinus pressure.   Respiratory: Positive for cough and shortness of breath.        Objective:   Filed Vitals:   07/10/13 1903  BP: 108/68  Pulse: 67  Temp: 98.7 F (37.1 C)  TempSrc: Oral  Resp: 16  Height: 5' (1.524 m)  Weight: 245 lb 9.6 oz (111.403 kg)  SpO2: 99%     Physical Exam  Vitals reviewed. Constitutional: She is oriented to person, place, and time. She appears well-developed and well-nourished. No distress.  HENT:  Head: Normocephalic and atraumatic.  Right Ear: Hearing, tympanic membrane, external ear and ear canal normal.  Left Ear: Hearing, tympanic membrane, external ear and ear canal normal.  Nose: Nose normal.  Mouth/Throat: Oropharynx is clear and moist. No oropharyngeal exudate.  Right ear minimal clear fluid behind Tm, no redness, canals are clear. left ear minimal clear fluid behind Tm, no redness, canals are clear.  Eyes: Conjunctivae and EOM are normal. Pupils are equal, round, and reactive to light.  Cardiovascular: Normal rate, regular rhythm, normal heart sounds and intact distal pulses.   No murmur heard. Pulmonary/Chest: Effort normal and breath sounds normal. No respiratory distress. She has no wheezes. She has no rhonchi.  Neurological: She is alert and oriented to person, place, and time.  Skin: Skin is warm and dry. No rash noted.  Psychiatric: She has a normal mood and affect. Her behavior is normal.    Results for orders placed in visit on 07/10/13  POCT WET PREP WITH KOH      Result Value Ref Range   Trichomonas, UA Negative     Clue Cells Wet Prep HPF POC 0-2     Epithelial Wet Prep HPF POC 2-3     Yeast Wet Prep HPF POC neg     Bacteria Wet Prep HPF POC 2+     RBC Wet Prep HPF POC neg     WBC Wet Prep HPF POC 5-10     KOH Prep POC Negative         Assessment & Plan:   Ashley Barnes is a 28 y.o. female Vaginal discharge - Plan: POCT Wet Prep with KOH, metroNIDAZOLE (FLAGYL) 500 MG  tablet, BV (bacterial vaginosis) - Plan: metroNIDAZOLE (FLAGYL) 500 MG tablet  - mild BV by testing.  Advised to wait until after abx if possible to start flagyl, or if started prior - may need longer course. Avoid ETOH on flagyl.   Cough, Serous otitis media  -prior fever now resolved. Difficult to tell if this was due to abx or viral syndrome with defervescence as natural course of illness. No apparent acute sinusitis or AOM on exam, but if persistent sinus/ear sx's after current abx course and not relieved with zyrtec and astelin for allergies - consider ENT eval.  rtc precautions.    Meds ordered this encounter  Medications  . metroNIDAZOLE (FLAGYL) 500 MG tablet    Sig: Take 1 tablet (500 mg total) by mouth 2 (two) times daily.    Dispense:  14 tablet    Refill:  0   Patient Instructions  Start flagyl after antibiotic for cough/fever.  Zyrtec and astelin spray for allergies.  Return to the clinic or go to the nearest emergency room if any of your symptoms worsen or new symptoms occur. Bacterial Vaginosis Bacterial vaginosis is a vaginal infection that occurs when the normal balance of bacteria in the vagina is disrupted. It results from an overgrowth of certain bacteria. This is the most common vaginal infection in women of childbearing age. Treatment is important to prevent complications, especially in pregnant women, as it can cause a premature delivery. CAUSES  Bacterial vaginosis is caused by an increase in harmful bacteria that are normally present in smaller amounts in the vagina. Several different kinds of bacteria can cause bacterial vaginosis. However, the reason that the condition develops is not fully understood. RISK FACTORS Certain activities or behaviors can put you at an increased risk of developing bacterial vaginosis, including:  Having a new sex partner or multiple sex partners.  Douching.  Using an intrauterine device (IUD) for contraception. Women do not get  bacterial vaginosis from toilet seats, bedding, swimming pools, or contact with objects around them. SIGNS AND SYMPTOMS  Some women with bacterial vaginosis have no signs or symptoms. Common symptoms include:  Grey vaginal discharge.  A fishlike odor with discharge, especially after sexual intercourse.  Itching or burning of the vagina and vulva.  Burning or pain with urination. DIAGNOSIS  Your health care provider will take a medical history and examine the vagina for signs of bacterial vaginosis. A sample of vaginal fluid may be taken. Your health care provider will look at this sample under a microscope to check for bacteria and abnormal cells. A vaginal pH test may also be done.  TREATMENT  Bacterial vaginosis may be treated with antibiotic medicines. These may be given in the form of a pill or a vaginal cream. A second round of antibiotics may be prescribed if the condition comes back after treatment.  HOME CARE INSTRUCTIONS   Only take over-the-counter or prescription medicines as directed by your health care provider.  If antibiotic medicine was prescribed, take it as directed. Make sure you finish it even if you start to feel better.  Do not have sex until treatment is completed.  Tell all sexual partners that you have a vaginal infection. They should see their health care provider and be treated if they have problems, such as a mild rash or itching.  Practice safe sex by using condoms and only having one sex partner. SEEK MEDICAL CARE IF:   Your symptoms are not improving after 3 days of treatment.  You have increased discharge or pain.  You have a fever. MAKE SURE YOU:   Understand these instructions.  Will watch your condition.  Will get help right away if you are not doing well or get worse. FOR MORE INFORMATION  Centers for Disease Control and Prevention, Division of STD Prevention: SolutionApps.co.za American Sexual Health Association (ASHA): www.ashastd.org    Document Released: 02/21/2005 Document Revised: 12/12/2012 Document Reviewed: 10/03/2012 [facility] Patient Information 2014 [place], [place].      I personally performed the services described in this documentation, which was scribed in my presence. The recorded information has been  reviewed and considered, and addended by me as needed.

## 2013-11-25 ENCOUNTER — Encounter: Payer: Self-pay | Admitting: Certified Nurse Midwife

## 2013-11-25 ENCOUNTER — Ambulatory Visit (INDEPENDENT_AMBULATORY_CARE_PROVIDER_SITE_OTHER): Payer: Managed Care, Other (non HMO) | Admitting: Certified Nurse Midwife

## 2013-11-25 VITALS — BP 132/80 | HR 72 | Ht 60.0 in | Wt 254.0 lb

## 2013-11-25 DIAGNOSIS — N912 Amenorrhea, unspecified: Secondary | ICD-10-CM

## 2013-11-25 DIAGNOSIS — Z3201 Encounter for pregnancy test, result positive: Secondary | ICD-10-CM

## 2013-11-25 DIAGNOSIS — O200 Threatened abortion: Secondary | ICD-10-CM

## 2013-11-25 LAB — HCG, QUANTITATIVE, PREGNANCY: HCG, BETA CHAIN, QUANT, S: 978 m[IU]/mL

## 2013-11-25 LAB — POCT URINE PREGNANCY: Preg Test, Ur: POSITIVE

## 2013-11-25 NOTE — Progress Notes (Signed)
28 y.o. single [id number] hispanic female here with complaint of increase vaginal discharge, with no other symptoms.Onset of symptoms 14 days ago. Patient describes discharge as mucosy at onset and has noted just slight brown/pink discharge now. No odor. LMP 10/20/13.Denies new personal products . Last sexual activity was 11/04/13, with condom accident, has had condom accident before, and no problems or pregnancy. Urinary symptoms just slight increase in frequency, but has increased water intake. No burning or urgency or pain. Marland KitchenPositve home UPT 11/24/13. Patient noting some mild cramping, "like period" today. Denies severe pain or continuous pain. Patient complaining of breast tenderness and slight nausea one week ago. Not sure how she feels about being pregnant, partner her previous ex partner is happy with news and supportive. Patient used Pamprin last week and alcohol at [employer] 2 weeks ago. No other medications or drugs other than multivitamin daily and probiotic and Albuterol inhaler,not used in several weeks.  Eating well, no other concerns. No STD concerns.   O:Healthy female WDWN Affect: normal, orientation x 3  Exam: Abdomen: soft, non tender, no rebound Lymph node: no enlargement or tenderness Pelvic exam: External genital:normal  BUS: negative Vagina: brown pink discharge noted. Ph:4.5   ,Wet prep taken negative for pathogens, positive for RBC's Cervix: nullparous, soft,slightly open with scant amount of brown/pink discharge present, non tender Uterus: normal, non tender, mid position Adnexa:normal, non tender, no large masses or fullness noted, hard to palpate due to body habitus   Wet Prep results: negative Positive UPT   A:Amenorrhea with positive pregnancy test with slight brown/ pink discharge noted 5 weeks 1 day with EDC 07-29-14 per dates only Threatened abortion in early pregnancy Unplanned pregnancy , not sure if she plans to continue pregnancy   P:Discussed findings of  positive UPT here also today. Discussed negative wet prep, other than brown/pink discharge noted from cervix, not in vagina. Discussed concerns with status of pregnancy continuation due to finding. Discussed importance of monitoring any bleeding she has and to advise. Discussed importance of good nutrition, prenatal vitamins and avoidance of alcohol or other medication use besides limited Tylenol. Patient not interested in other information, until she sees if the cramping and slight spotting has resolved. Discussed will check HCG quant. And will need Blood Type and RH. Patient has [facility] card and is O positive.( will scan in Epic.) Patient to call if spotting amount changes. Will call patient with HCG results when in  and  will repeat HCG quant tomorrow per consult with Dr.Lathrop. May need PUS depending on results. Patient aware Information on bleeding in pregnancy and pregnancy care given.  RV prn.

## 2013-11-25 NOTE — Patient Instructions (Signed)
Prenatal Care  WHAT IS PRENATAL CARE?  Prenatal care means health care during your pregnancy, before your baby is born. It is very important to take care of yourself and your baby during your pregnancy by:   Getting early prenatal care. If you know you are pregnant, or think you might be pregnant, call your health care provider as soon as possible. Schedule a visit for a prenatal exam.  Getting regular prenatal care. Follow your health care provider's schedule for blood and other necessary tests. Do not miss appointments.  Doing everything you can to keep yourself and your baby healthy during your pregnancy.  Getting complete care. Prenatal care should include evaluation of the medical, dietary, educational, psychological, and social needs of you and your significant other. The medical and genetic history of your family and the family of your baby's father should be discussed with your health care provider.  Discussing with your health care provider:  Prescription, over-the-counter, and herbal medicines that you take.  Any history of substance abuse, alcohol use, smoking, and illegal drug use.  Any history of domestic abuse and violence.  Immunizations you have received.  Your nutrition and diet.  The amount of exercise you do.  Any environmental and occupational hazards to which you are exposed.  History of sexually transmitted infections for both you and your partner.  Previous pregnancies you have had. WHY IS PRENATAL CARE SO IMPORTANT?  By regularly seeing your health care provider, you help ensure that problems can be identified early so that they can be treated as soon as possible. Other problems might be prevented. Many studies have shown that early and regular prenatal care is important for the health of mothers and their babies.  HOW CAN I TAKE CARE OF MYSELF WHILE I AM PREGNANT?  Here are ways to take care of yourself and your baby:   Start or continue taking your  multivitamin with 400 micrograms (mcg) of folic acid every day.  Get early and regular prenatal care. It is very important to see a health care provider during your pregnancy. Your health care provider will check at each visit to make sure that you and your baby are healthy. If there are any problems, action can be taken right away to help you and your baby.  Eat a healthy diet that includes:  Fruits.  Vegetables.  Foods low in saturated fat.  Whole grains.  Calcium-rich foods, such as milk, yogurt, and hard cheeses.  Drink 6-8 glasses of liquids a day.  Unless your health care provider tells you not to, try to be physically active for 30 minutes, most days of the week. If you are pressed for time, you can get your activity in through 10-minute segments, three times a day.  Do not smoke, drink alcohol, or use drugs. These can cause long-term damage to your baby. Talk with your health care provider about steps to take to stop smoking. Talk with a member of your faith community, a counselor, a trusted friend, or your health care provider if you are concerned about your alcohol or drug use.  Ask your health care provider before taking any medicine, even over-the-counter medicines. Some medicines are not safe to take during pregnancy.  Get plenty of rest and sleep.  Avoid hot tubs and saunas during pregnancy.  Do not have X-rays taken unless absolutely necessary and with the recommendation of your health care provider. A lead shield can be placed on your abdomen to protect your baby when   X-rays are taken in other parts of your body.  Do not empty the cat litter when you are pregnant. It may contain a parasite that causes an infection called toxoplasmosis, which can cause birth defects. Also, use gloves when working in garden areas used by cats.  Do not eat uncooked or undercooked meats or fish.  Do not eat soft, mold-ripened cheeses (Brie, Camembert, and chevre) or soft, blue-veined  cheese (Danish blue and Roquefort).  Stay away from toxic chemicals like:  Insecticides.  Solvents (some cleaners or paint thinners).  Lead.  Mercury.  Sexual intercourse may continue until the end of the pregnancy, unless you have a medical problem or there is a problem with the pregnancy and your health care provider tells you not to.  Do not wear high-heel shoes, especially during the second half of the pregnancy. You can lose your balance and fall.  Do not take long trips, unless absolutely necessary. Be sure to see your health care provider before going on the trip.  Do not sit in one position for more than 2 hours when on a trip.  Take a copy of your medical records when going on a trip. Know where a hospital is located in the city you are visiting, in case of an emergency.  Most dangerous household products will have pregnancy warnings on their labels. Ask your health care provider about products if you are unsure.  Limit or eliminate your caffeine intake from coffee, tea, sodas, medicines, and chocolate.  Many women continue working through pregnancy. Staying active might help you stay healthier. If you have a question about the safety or the hours you work at your particular job, talk with your health care provider.  Get informed:  Read books.  Watch videos.  Go to childbirth classes for you and your significant other.  Talk with experienced moms.  Ask your health care provider about childbirth education classes for you and your partner. Classes can help you and your partner prepare for the birth of your baby.  Ask about a baby doctor (pediatrician) and methods and pain medicine for labor, delivery, and possible cesarean delivery. HOW OFTEN SHOULD I SEE MY HEALTH CARE PROVIDER DURING PREGNANCY?  Your health care provider will give you a schedule for your prenatal visits. You will have visits more often as you get closer to the end of your pregnancy. An average  pregnancy lasts about 40 weeks.  A typical schedule includes visiting your health care provider:   About once each month during your first 6 months of pregnancy.  Every 2 weeks during the next 2 months.  Weekly in the last month, until the delivery date. Your health care provider will probably want to see you more often if:  You are older than 35 years.  Your pregnancy is high risk because you have certain health problems or problems with the pregnancy, such as:  Diabetes.  High blood pressure.  The baby is not growing on schedule, according to the dates of the pregnancy. Your health care provider will do special tests to make sure you and your baby are not having any serious problems. WHAT HAPPENS DURING PRENATAL VISITS?   At your first prenatal visit, your health care provider will do a physical exam and talk to you about your health history and the health history of your partner and your family. Your health care provider will be able to tell you what date to expect your baby to be born on.  Your   first physical exam will include checks of your blood pressure, measurements of your height and weight, and an exam of your pelvic organs. Your health care provider will do a Pap test if you have not had one recently and will do cultures of your cervix to make sure there is no infection.  At each prenatal visit, there will be tests of your blood, urine, blood pressure, weight, and the progress of the baby will be checked.  At your later prenatal visits, your health care provider will check how you are doing and how your baby is developing. You may have a number of tests done as your pregnancy progresses.  Ultrasound exams are often used to check on your baby's growth and health.  You may have more urine and blood tests, as well as special tests, if needed. These may include amniocentesis to examine fluid in the pregnancy sac, stress tests to check how the baby responds to contractions, or a  biophysical profile to measure your baby's well-being. Your health care provider will explain the tests and why they are necessary.  You should be tested for high blood sugar (gestational diabetes) between the 24th and 28th weeks of your pregnancy.  You should discuss with your health care provider your plans to breastfeed or bottle-feed your baby.  Each visit is also a chance for you to learn about staying healthy during pregnancy and to ask questions. Document Released: 02/24/2003 Document Revised: 02/26/2013 Document Reviewed: 05/08/2013 [facility] Patient Information 2015 [place], [place]. This information is not intended to replace advice given to you by your health care provider. Make sure you discuss any questions you have with your health care provider. Vaginal Bleeding During Pregnancy, First Trimester A small amount of bleeding (spotting) from the vagina is common in early pregnancy. Sometimes the bleeding is normal and is not a problem, and sometimes it is a sign of something serious. Be sure to tell your doctor about any bleeding from your vagina right away. HOME CARE  Watch your condition for any changes.  Follow your doctor's instructions about how active you can be.  If you are on bed rest:  You may need to stay in bed and only get up to use the bathroom.  You may be allowed to do some activities.  If you need help, make plans for someone to help you.  Write down:  The number of pads you use each day.  How often you change pads.  How soaked (saturated) your pads are.  Do not use tampons.  Do not douche.  Do not have sex or orgasms until your doctor says it is okay.  If you pass any tissue from your vagina, save the tissue so you can show it to your doctor.  Only take medicines as told by your doctor.  Do not take aspirin because it can make you bleed.  Keep all follow-up visits as told by your doctor. GET HELP IF:   You bleed from your vagina.  You have  cramps.  You have labor pains.  You have a fever that does not go away after you take medicine. GET HELP RIGHT AWAY IF:   You have very bad cramps in your back or belly (abdomen).  You pass large clots or tissue from your vagina.  You bleed more.  You feel light-headed or weak.  You pass out (faint).  You have chills.  You are leaking fluid or have a gush of fluid from your vagina.  You pass  out while pooping (having a bowel movement). MAKE SURE YOU:  Understand these instructions.  Will watch your condition.  Will get help right away if you are not doing well or get worse. Document Released: 07/08/2013 Document Reviewed: 10/29/2012 [facility] Patient Information 2015 [place], [place]. This information is not intended to replace advice given to you by your health care provider. Make sure you discuss any questions you have with your health care provider.

## 2013-11-26 ENCOUNTER — Encounter: Payer: Self-pay | Admitting: Certified Nurse Midwife

## 2013-11-26 ENCOUNTER — Other Ambulatory Visit: Payer: Managed Care, Other (non HMO)

## 2013-11-26 ENCOUNTER — Telehealth: Payer: Self-pay

## 2013-11-26 DIAGNOSIS — Z3201 Encounter for pregnancy test, result positive: Secondary | ICD-10-CM

## 2013-11-26 DIAGNOSIS — O200 Threatened abortion: Secondary | ICD-10-CM

## 2013-11-26 NOTE — Telephone Encounter (Signed)
Message copied by Jannet Askew on Tue Nov 26, 2013  8:34 AM ------      Message from: Verner Chol      Created: Mon Nov 25, 2013 10:53 PM       Notify patient that HCG is 39 which is low for her gestational age, needs repeat 11/26/13 in early afternoon. Order in ------

## 2013-11-26 NOTE — Telephone Encounter (Signed)
Spoke with patient. Advised of results as seen below. Patient is agreeable. Appointment scheduled for today at 2:30pm as patient has conference call with work until 2pm.   Routing to provider for final review. Patient agreeable to disposition. Will close encounter

## 2013-11-27 ENCOUNTER — Telehealth: Payer: Self-pay | Admitting: Certified Nurse Midwife

## 2013-11-27 DIAGNOSIS — O3680X0 Pregnancy with inconclusive fetal viability, not applicable or unspecified: Secondary | ICD-10-CM

## 2013-11-27 LAB — HCG, QUANTITATIVE, PREGNANCY: hCG, Beta Chain, Quant, S: 1985.3 m[IU]/mL

## 2013-11-27 NOTE — Telephone Encounter (Signed)
Patient is calling for results from her labs done yesterday. Patient says she was told the results would be in today.

## 2013-11-27 NOTE — Telephone Encounter (Signed)
Message copied by Jannet Askew on Wed Nov 27, 2013  4:52 PM ------      Message from: Verner Chol      Created: Wed Nov 27, 2013  1:08 PM       Addendum to note, per Dr Hyacinth Meeker schedule PUS for next week also ------

## 2013-11-27 NOTE — Telephone Encounter (Signed)
Routing to Verner Chol CNM for review and advise of lab results from 9/22.

## 2013-11-27 NOTE — Telephone Encounter (Signed)
Spoke with patient. Advised of results as seen below. Patient is agreeable and verbalizes understanding. Lab appointment scheduled for Monday at 8:30am. Ultrasound is scheduled for Thursday 10/1 at 8am with 8:30am consult with Dr.Silva. Patient is agreeable to date and time of both appointments. Patient states that she is still having some spotting and cramping. Advised would let Verner Chol CNM know just in case something further needs to be done before next week. Patient is agreeable. Order entered for PUS.   Cc: Dr.Silva

## 2013-11-27 NOTE — Progress Notes (Signed)
HCG rising appropriately .  Needs viability scan scheduled.  Can do next week.  May still be early but will be able to see something on u/s by then.  Reviewed personally.  Lum Keas, MD.

## 2013-11-27 NOTE — Telephone Encounter (Signed)
Patient needs to advise if spotting or cramping increases.

## 2013-11-28 ENCOUNTER — Other Ambulatory Visit: Payer: Self-pay | Admitting: Obstetrics and Gynecology

## 2013-11-28 DIAGNOSIS — O200 Threatened abortion: Secondary | ICD-10-CM

## 2013-11-28 NOTE — Telephone Encounter (Signed)
I agree with recommendations as previously given.  Will add blood type and Rh for her next lab draw.

## 2013-11-28 NOTE — Telephone Encounter (Signed)
Spoke with patient. Advised of message as seen below from Verner Chol CNM. Patient is agreeable and verbalizes understanding. Patient states that her bleeding and cramping "has stayed the same." Will call if there are any changes.  Routing to provider for final review. Patient agreeable to disposition. Will close encounter

## 2013-11-29 ENCOUNTER — Inpatient Hospital Stay (HOSPITAL_COMMUNITY): Payer: Managed Care, Other (non HMO)

## 2013-11-29 ENCOUNTER — Telehealth: Payer: Self-pay | Admitting: Certified Nurse Midwife

## 2013-11-29 ENCOUNTER — Inpatient Hospital Stay (HOSPITAL_COMMUNITY)
Admission: AD | Admit: 2013-11-29 | Discharge: 2013-11-29 | Disposition: A | Discharge status: Home / Self Care | Payer: Managed Care, Other (non HMO) | Source: Ambulatory Visit | Attending: Obstetrics and Gynecology | Admitting: Obstetrics and Gynecology

## 2013-11-29 ENCOUNTER — Encounter (HOSPITAL_COMMUNITY): Payer: Self-pay | Admitting: *Deleted

## 2013-11-29 DIAGNOSIS — Z880 Allergy status to penicillin: Secondary | ICD-10-CM | POA: Diagnosis not present

## 2013-11-29 DIAGNOSIS — O26859 Spotting complicating pregnancy, unspecified trimester: Secondary | ICD-10-CM | POA: Diagnosis not present

## 2013-11-29 DIAGNOSIS — O26851 Spotting complicating pregnancy, first trimester: Secondary | ICD-10-CM

## 2013-11-29 LAB — CBC
HEMATOCRIT: 38.5 % (ref 36.0–46.0)
HEMOGLOBIN: 13.3 g/dL (ref 12.0–15.0)
MCH: 27.9 pg (ref 26.0–34.0)
MCHC: 34.5 g/dL (ref 30.0–36.0)
MCV: 80.7 fL (ref 78.0–100.0)
Platelets: 290 10*3/uL (ref 150–400)
RBC: 4.77 MIL/uL (ref 3.87–5.11)
RDW: 14.5 % (ref 11.5–15.5)
WBC: 8.5 10*3/uL (ref 4.0–10.5)

## 2013-11-29 LAB — HCG, QUANTITATIVE, PREGNANCY: HCG, BETA CHAIN, QUANT, S: 4136 m[IU]/mL — AB (ref <5)

## 2013-11-29 LAB — ABO/RH: ABO/RH(D): O POS

## 2013-11-29 NOTE — MAU Note (Signed)
preg confirmed on Monday, had been having brown d/c for 2 wks.  Had blood work earlier in the week, it was rising appropriately.  Was to f/u on Mon  In office. Today it changed to red, was instructed to come here.

## 2013-11-29 NOTE — Telephone Encounter (Signed)
Patient has a medication question for a nurse. Patient is pregnant and is wondering if she can take a certain medication.

## 2013-11-29 NOTE — Telephone Encounter (Signed)
Patient calling to report discharge has turned to vaginal bleeding. Please advise?

## 2013-11-29 NOTE — MAU Provider Note (Signed)
History     CSN: [id number]  Arrival date and time: 11/29/13 1521   First Provider Initiated Contact with Patient 11/29/13 1608      No chief complaint on file.  HPI  Ashley Barnes is a 28 y.o. G1P0000 at Unknown who presents today with vaginal bleeding. She states that she has been bleeding for about 14 days. She was seen in the office and had rising HCG levels. However today the bleeding went from brown to red. She also states that she had cramping as well, and today it is about the same as it has been.   Past Medical History  Diagnosis Date  . Obesity   . Allergy   . Asthma   . Recurrent sinusitis   . Anemia   . Abnormal Pap smear     12-29-09 ASCUS+HPV, 01-04-12 LSIL, 01-10-13 ASCUS +HPV  . Migraines     no aura  . Blood type O+     on donor card from [facility]    Past Surgical History  Procedure Laterality Date  . Wisdom tooth extraction    . 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 Diabetes Mother 70  . Hypertension Mother   . Heart disease Father 70    AMI x 3  . Hypertension Father   . Cancer Paternal Grandfather     colon    History  Substance Use Topics  . Smoking status: Never Smoker   . Smokeless tobacco: Never Used  . Alcohol Use: No    Allergies:  Allergies  Allergen Reactions  . Penicillins Anaphylaxis  . Corticosteroids Hives  . Zocor [Simvastatin - High Dose] Hives and Rash    Prescriptions prior to admission  Medication Sig Dispense Refill  . albuterol (PROVENTIL HFA;VENTOLIN HFA) 108 (90 BASE) MCG/ACT inhaler Inhale 2 puffs into the lungs every 6 (six) hours as needed for wheezing or shortness of breath.  1 Inhaler  0  . B Complex-C (B-COMPLEX WITH VITAMIN C) tablet Take 1 tablet by mouth every morning.      Marland Kitchen ipratropium (ATROVENT) 0.06 % nasal spray Place 2 sprays into both nostrils 4 (four) times daily as needed for rhinitis.      . Multiple Vitamin (MULTIVITAMIN)  tablet Take 1 tablet by mouth every morning.       . Probiotic Product (PROBIOTIC PO) Take 1 capsule by mouth every morning.         ROS Physical Exam   Blood pressure 154/82, pulse 98, temperature 98.4 F (36.9 C), temperature source Oral, resp. rate 18, height  (1.549 m), weight 115.214 kg (254 lb), last menstrual period 10/20/2013.  Physical Exam  Nursing note and vitals reviewed. Constitutional: She is oriented to person, place, and time. She appears well-developed and well-nourished. No distress.  Cardiovascular: Normal rate.   Respiratory: Effort normal.  GI: Soft. There is no tenderness. There is no rebound.  Neurological: She is alert and oriented to person, place, and time.  Skin: Skin is dry.  Psychiatric: She has a normal mood and affect.    MAU Course  Procedures Results for orders placed during the hospital encounter of 11/29/13 (from the past 24 hour(s))  CBC     Status: None   Collection Time    11/29/13  3:38 PM      Result Value Ref Range   WBC 8.5  4.0 -  10.5 K/uL   RBC 4.77  3.87 - 5.11 MIL/uL   Hemoglobin 13.3  12.0 - 15.0 g/dL   HCT 40.9  81.1 - 91.4 %   MCV 80.7  78.0 - 100.0 fL   MCH 27.9  26.0 - 34.0 pg   MCHC 34.5  30.0 - 36.0 g/dL   RDW 78.2  95.6 - 21.3 %   Platelets 290  150 - 400 K/uL  HCG, QUANTITATIVE, PREGNANCY     Status: Abnormal   Collection Time    11/29/13  3:38 PM      Result Value Ref Range   hCG, Beta Chain, Quant, S 4136 (*) <5 mIU/mL  ABO/RH     Status: None   Collection Time    11/29/13  3:38 PM      Result Value Ref Range   ABO/RH(D) O POS     US Ob Comp Less 14 Wks  11/29/2013   CLINICAL DATA:  First trimester pregnancy with vaginal bleeding.  EXAM: OBSTETRIC <14 WK [place] AND TRANSVAGINAL OB US  TECHNIQUE: Both transabdominal and transvaginal ultrasound examinations were performed for complete evaluation of the gestation as well as the maternal uterus, adnexal regions, and pelvic cul-de-sac. Transvaginal technique was  performed to assess early pregnancy.  COMPARISON:  None.  FINDINGS: Intrauterine gestational sac: Single intrauterine gestational sac.  Yolk sac:  Not visualized.  Embryo:  Not visualized.  MSD:  8.0  mm   5 w   4  d                [place] EDC: 07/28/2014  Maternal uterus/adnexae: There is subchorionic hemorrhage which the sonographer felt was large. This is difficult to differentiate from the endometrium on the images, but I discussed this with her by telephone. There is no adnexal mass or free pelvic fluid.  IMPRESSION: 1. Probable early intrauterine gestational sac, but no yolk sac, fetal pole, or cardiac activity yet visualized. Recommend follow-up quantitative B-HCG levels and follow-up US in 14 days to confirm and assess viability. This recommendation follows SRU consensus guidelines: Diagnostic Criteria for Nonviable Pregnancy Early in the First Trimester. Malva Limes Med 2013; 086:5784-69. 2. Subchorionic hemorrhage. 3. These results were called by telephone at the time of interpretation on 11/29/2013 at 5:51 pm to Dr. Thressa Sheller , who verbally acknowledged these results.   Electronically Signed   By: Roxy Horseman M.D.   On: 11/29/2013 17:53   US Ob Transvaginal  11/29/2013   CLINICAL DATA:  First trimester pregnancy with vaginal bleeding.  EXAM: OBSTETRIC <14 WK [place] AND TRANSVAGINAL OB US  TECHNIQUE: Both transabdominal and transvaginal ultrasound examinations were performed for complete evaluation of the gestation as well as the maternal uterus, adnexal regions, and pelvic cul-de-sac. Transvaginal technique was performed to assess early pregnancy.  COMPARISON:  None.  FINDINGS: Intrauterine gestational sac: Single intrauterine gestational sac.  Yolk sac:  Not visualized.  Embryo:  Not visualized.  MSD:  8.0  mm   5 w   4  d                [place] EDC: 07/28/2014  Maternal uterus/adnexae: There is subchorionic hemorrhage which the sonographer felt was large. This is difficult to differentiate from the endometrium on  the images, but I discussed this with her by telephone. There is no adnexal mass or free pelvic fluid.  IMPRESSION: 1. Probable early intrauterine gestational sac, but no yolk sac, fetal pole, or cardiac activity yet visualized. Recommend  follow-up quantitative B-HCG levels and follow-up US in 14 days to confirm and assess viability. This recommendation follows SRU consensus guidelines: Diagnostic Criteria for Nonviable Pregnancy Early in the First Trimester. Malva Limes Med 2013; 811:9147-82. 2. Subchorionic hemorrhage. 3. These results were called by telephone at the time of interpretation on 11/29/2013 at 5:51 pm to Dr. Thressa Sheller , who verbally acknowledged these results.   Electronically Signed   By: Roxy Horseman M.D.   On: 11/29/2013 17:53   1825: D/W Dr. Farrel Gobble, plan to repeat HCG on Monday and [place] on Thursday in the office.  Assessment and Plan   1. Spotting affecting pregnancy in first trimester, antepartum    Bleeding precautions Return to MAU as needed FU with the office as planned for blood work on Monday and [place] on Thursday  Follow-up Information   Follow up with [facility], CNM. (As scheduled)    Specialty:  Certified Nurse Midwife   Contact information:   [address] [place] [address] [phone]        Tawnya Crook 11/29/2013, 4:14 PM

## 2013-11-29 NOTE — Discharge Instructions (Signed)
Vaginal Bleeding During Pregnancy, First Trimester  A small amount of bleeding (spotting) from the vagina is relatively common in early pregnancy. It usually stops on its own. Various things may cause bleeding or spotting in early pregnancy. Some bleeding may be related to the pregnancy, and some may not. In most cases, the bleeding is normal and is not a problem. However, bleeding can also be a sign of something serious. Be sure to tell your health care provider about any vaginal bleeding right away.  Some possible causes of vaginal bleeding during the first trimester include:  · Infection or inflammation of the cervix.  · Growths (polyps) on the cervix.  · Miscarriage or threatened miscarriage.  · Pregnancy tissue has developed outside of the uterus and in a fallopian tube (tubal pregnancy).  · Tiny cysts have developed in the uterus instead of pregnancy tissue (molar pregnancy).  HOME CARE INSTRUCTIONS   Watch your condition for any changes. The following actions may help to lessen any discomfort you are feeling:  · Follow your health care provider's instructions for limiting your activity. If your health care provider orders bed rest, you may need to stay in bed and only get up to use the bathroom. However, your health care provider may allow you to continue light activity.  · If needed, make plans for someone to help with your regular activities and responsibilities while you are on bed rest.  · Keep track of the number of pads you use each day, how often you change pads, and how soaked (saturated) they are. Write this down.  · Do not use tampons. Do not douche.  · Do not have sexual intercourse or orgasms until approved by your health care provider.  · If you pass any tissue from your vagina, save the tissue so you can show it to your health care provider.  · Only take over-the-counter or prescription medicines as directed by your health care provider.  · Do not take aspirin because it can make you  bleed.  · Keep all follow-up appointments as directed by your health care provider.  SEEK MEDICAL CARE IF:  · You have any vaginal bleeding during any part of your pregnancy.  · You have cramps or labor pains.  · You have a fever, not controlled by medicine.  SEEK IMMEDIATE MEDICAL CARE IF:   · You have severe cramps in your back or belly (abdomen).  · You pass large clots or tissue from your vagina.  · Your bleeding increases.  · You feel light-headed or weak, or you have fainting episodes.  · You have chills.  · You are leaking fluid or have a gush of fluid from your vagina.  · You pass out while having a bowel movement.  MAKE SURE YOU:  · Understand these instructions.  · Will watch your condition.  · Will get help right away if you are not doing well or get worse.  Document Released: 12/01/2004 Document Revised: 02/26/2013 Document Reviewed: 10/29/2012  [employer]® Patient Information ©2015 [employer]. This information is not intended to replace advice given to you by your health care provider. Make sure you discuss any questions you have with your health care provider.

## 2013-11-29 NOTE — Telephone Encounter (Signed)
Spoke with patient. Patient states that she usually takes zyrtec and uses atrovent. Patient has not been using these with pregnancy but states that her nose is stuffed up and she can't stop sneezing. "I have really bad allergies and it is right in the middle of allergy season. What would be okay for me to take?" Advised patient would send a message over to the covering provider to see what would be recommended for patient to use and return call. Patient is agreeable.  Routing to [place], FNP as covering Cc: Verner Chol CNM

## 2013-11-29 NOTE — MAU Note (Signed)
Urine in lab 

## 2013-11-29 NOTE — Telephone Encounter (Signed)
Spoke with patient.  She states that starting right after her initial call to our office, she used urinated and had bright red blood noted on toilet tissue. Patient feels this blood is coming from her vagina. This has now occurred more than once.  Patient denies pain and fevers. Has been resting. No sexual activity.    Spoke with Dr. Edward Jolly orders received to go to MAU for evaluation.   Spoke with patient and she is agreeable to this plan.  She will go to MAU now.   Routing to Dr. Edward Jolly for signature and routing to Dr. Farrel Gobble as on call.

## 2013-11-29 NOTE — Telephone Encounter (Signed)
I discussed this plan with you to have the patient present to MAU so she can proceed with a STAT quantitative beta HCG and ultrasound if necessary.   Patient has previously had normally rising quant BHCGs.  Cc - Douglass Rivers, MD

## 2013-12-02 ENCOUNTER — Other Ambulatory Visit: Payer: Self-pay | Admitting: Obstetrics and Gynecology

## 2013-12-02 ENCOUNTER — Other Ambulatory Visit (INDEPENDENT_AMBULATORY_CARE_PROVIDER_SITE_OTHER): Payer: Managed Care, Other (non HMO)

## 2013-12-02 DIAGNOSIS — O200 Threatened abortion: Secondary | ICD-10-CM

## 2013-12-02 NOTE — Progress Notes (Signed)
Unable to place order for quantitative beta HCG drawn today.  Will send this message to the lab.

## 2013-12-03 ENCOUNTER — Telehealth: Payer: Self-pay | Admitting: Obstetrics and Gynecology

## 2013-12-03 ENCOUNTER — Telehealth: Payer: Self-pay | Admitting: Certified Nurse Midwife

## 2013-12-03 NOTE — Telephone Encounter (Signed)
Patient is asking if her lab results are back?

## 2013-12-03 NOTE — Progress Notes (Signed)
Order placed for repeat HCG as Ashley Barnes in lab did not have order to run lab work from yesterday 9/28. Will close the encounter.

## 2013-12-03 NOTE — Telephone Encounter (Signed)
Spoke with patient. Advised lab results from yesterday 9/29 are not back yet. Advised someone will be in contact with patient as soon as they are received and reviewed by Verner Choleborah S. Leonard CNM. Patient is agreeable and verbalizes understanding.  Routing to provider for final review. Patient agreeable to disposition. Will close encounter

## 2013-12-03 NOTE — Telephone Encounter (Signed)
Left message for patient to call back. Need to provider oop for PUS. PR: $79.74

## 2013-12-03 NOTE — Telephone Encounter (Signed)
Spoke with patient. Advised that per benefit quote received, she will be responsible to pay $79.74 when she comes in for PUS. Patient agreeable.

## 2013-12-04 LAB — HCG, QUANTITATIVE, PREGNANCY: hCG, Beta Chain, Quant, S: 8967.1 m[IU]/mL

## 2013-12-05 ENCOUNTER — Other Ambulatory Visit: Payer: Self-pay | Admitting: Obstetrics and Gynecology

## 2013-12-05 ENCOUNTER — Ambulatory Visit (INDEPENDENT_AMBULATORY_CARE_PROVIDER_SITE_OTHER): Payer: Managed Care, Other (non HMO)

## 2013-12-05 ENCOUNTER — Encounter: Payer: Self-pay | Admitting: Obstetrics and Gynecology

## 2013-12-05 ENCOUNTER — Ambulatory Visit (INDEPENDENT_AMBULATORY_CARE_PROVIDER_SITE_OTHER): Payer: Managed Care, Other (non HMO) | Admitting: Obstetrics and Gynecology

## 2013-12-05 VITALS — BP 122/78 | HR 72 | Ht 61.0 in | Wt 254.0 lb

## 2013-12-05 DIAGNOSIS — O200 Threatened abortion: Secondary | ICD-10-CM

## 2013-12-05 DIAGNOSIS — O3680X0 Pregnancy with inconclusive fetal viability, not applicable or unspecified: Secondary | ICD-10-CM

## 2013-12-05 LAB — US OB TRANSVAGINAL

## 2013-12-05 NOTE — Progress Notes (Signed)
Subjective  28 yo G1P0 LMP 10/20/13. Patient is here for OB ultrasound to check for viability.  Has had vaginal bleeding since 9/11.  Still spotting now. Was cramping, but now stopped. On PNV.   Has allergies.  Having some cough.  Has Albuterol inhaler.   BT - O+.  Quants beta HCGs - 978 9/21, 1985 9/22, 4136 9/25, 8967.1 9/29.  Objective - See ultrasound below.  Images not able to see due to problem with Canopy.  Still picture of IUP noted.  Size equal dates.  5 + 6 weeks.  Cardiac activity noted.  Small right CL cyst.  No free fluid.      Assessment   Threatened abortion.  Viable IUP. Allergies.   Plan  Discussed findings and cause of bleeding - subchorionic hemorrhage noted at the hospital US. [facility] for care.  List of providers given.  Claritin and Albuterol inhaler prn.  Follow up after delivery.   15 minutes face to face time of which over 50% was spent in counseling.   After visit summary to patient.

## 2013-12-05 NOTE — Patient Instructions (Signed)
Please establish care with your OB office.  Our best wishes for a healthy pregnancy!

## 2013-12-18 LAB — OB RESULTS CONSOLE GC/CHLAMYDIA
CHLAMYDIA, DNA PROBE: NEGATIVE
Gonorrhea: NEGATIVE

## 2014-01-06 ENCOUNTER — Encounter: Payer: Self-pay | Admitting: Obstetrics and Gynecology

## 2014-01-15 LAB — OB RESULTS CONSOLE HEPATITIS B SURFACE ANTIGEN: HEP B S AG: NEGATIVE

## 2014-01-15 LAB — OB RESULTS CONSOLE ANTIBODY SCREEN: ANTIBODY SCREEN: NEGATIVE

## 2014-01-15 LAB — OB RESULTS CONSOLE RPR: RPR: NONREACTIVE

## 2014-01-15 LAB — OB RESULTS CONSOLE ABO/RH: RH Type: POSITIVE

## 2014-01-15 LAB — OB RESULTS CONSOLE RUBELLA ANTIBODY, IGM: Rubella: IMMUNE

## 2014-01-15 LAB — OB RESULTS CONSOLE HIV ANTIBODY (ROUTINE TESTING): HIV: NONREACTIVE

## 2014-01-16 ENCOUNTER — Ambulatory Visit: Payer: 59 | Admitting: Certified Nurse Midwife

## 2014-03-07 NOTE — L&D Delivery Note (Signed)
Patient was C/C/+1 and pushed for 46 minutes with epidural.   NSVD  female infant, Apgars 9,9, weight P.   The patient had one midline first degree perineal laceration repaired with 2-0 vicryl R. Fundus was firm. EBL was expected amount- my estimate 350, measured 295. Placenta was delivered intact. Vagina was clear.  Baby was vigorous and doing skin to skin with mother.  Heyward Douthit A

## 2014-04-02 ENCOUNTER — Telehealth: Payer: Self-pay | Admitting: *Deleted

## 2014-04-02 NOTE — Telephone Encounter (Signed)
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. Please advise recall action.

## 2014-04-03 NOTE — Telephone Encounter (Signed)
Out of pap recall.  Thanks.

## 2014-04-03 NOTE — Telephone Encounter (Signed)
Pt removed from pap recall.

## 2014-06-23 ENCOUNTER — Ambulatory Visit: Payer: BLUE CROSS/BLUE SHIELD | Attending: Obstetrics & Gynecology | Admitting: Physical Therapy

## 2014-06-23 ENCOUNTER — Encounter: Payer: Self-pay | Admitting: Physical Therapy

## 2014-06-23 DIAGNOSIS — M25531 Pain in right wrist: Secondary | ICD-10-CM | POA: Diagnosis not present

## 2014-06-23 DIAGNOSIS — M25532 Pain in left wrist: Secondary | ICD-10-CM | POA: Diagnosis not present

## 2014-06-23 NOTE — Therapy (Signed)
[facility] [address] [place], [place], [address] Phone: [phone]   Fax:  [phone]  Physical Therapy Evaluation  Patient Details  Name: Ashley Barnes MRN: [id number] Date of Birth: Jul 09, 1985 Referring Provider:  Essie HartPinn, Walda, MD  Encounter Date: 06/23/2014      PT End of Session - 06/23/14 1013    Visit Number 1   Date for PT Re-Evaluation 08/23/14   PT Start Time 0936   PT Stop Time 1015   PT Time Calculation (min) 39 min   Activity Tolerance Patient tolerated treatment well      Past Medical History  Diagnosis Date  . Obesity   . Allergy   . Asthma   . Recurrent sinusitis   . Anemia   . Abnormal Pap smear     12-29-09 ASCUS+HPV, 01-04-12 LSIL, 01-10-13 ASCUS +HPV  . Migraines     no aura  . Blood type O+     on donor card from [employer] [employer] [facility]    Past Surgical History  Procedure Laterality Date  . Wisdom tooth extraction    . Colposcopy      2011 & 2013 CIN1, 02-06-13     There were no vitals filed for this visit.  Visit Diagnosis:  Pain in both wrists - Plan: PT plan of care cert/re-cert      Subjective Assessment - 06/23/14 0942    Subjective Patient reports that she has bilateral carpal tunnel syndrome right > left.  Started with her pregnancy, she is 8 months, due May 27th.   Limitations Lifting;House hold activities   Diagnostic tests none   Patient Stated Goals no pain   Currently in Pain? Yes   Pain Score 8    Pain Location Wrist   Pain Orientation Right;Left   Pain Descriptors / Indicators Aching;Dull   Pain Type Acute pain   Pain Onset More than a month ago   Pain Frequency Constant   Aggravating Factors  Night is worse, and working at computer increases the pain   Pain Relieving Factors ice   Effect of Pain on Daily Activities difficulty with work            [facility] PT Assessment - 06/23/14 0001    Assessment   Medical Diagnosis carpal tunnel syndrome   Onset  Date 10/22/13   Prior Therapy n   Precautions   Precaution Comments 8 months pregnant   Restrictions   Weight Bearing Restrictions No   Balance Screen   Has the patient fallen in the past 6 months No   Has the patient had a decrease in activity level because of a fear of falling?  No   Is the patient reluctant to leave their home because of a fear of falling?  No   Home Environment   Living Enviornment Private residence   Additional Comments does her own housework   Prior Function   Vocation Full time employment   Vocation Requirements "ten [employer]"   Leisure walks   Sensation   Additional Comments normal   Posture/Postural Control   Posture Comments fwd head, ropunded shoulders   ROM / Strength   AROM / PROM / Strength --  50 degrees extension of wrist with pain, flexion WNL's   Strength   Overall Strength Comments grip strength is 35# bilaterally, she is right handed   Palpation   Palpation tender in the right forearm and into the wrist   Special Tests  Special Tests --  positive wrist flexor tightness                   [facility] Adult PT Treatment/Exercise - 06/23/14 0001    Modalities   Modalities Ultrasound   Ultrasound   Ultrasound Location right wrist   Ultrasound Parameters 50% 1.0 W/CM2   Ultrasound Goals --  inflammation                PT Education - 06/23/14 1012    Education provided Yes   Education Details wrist stretches   Person(s) Educated Patient   Methods Handout;Demonstration;Explanation   Comprehension Verbalized understanding;Returned demonstration          PT Short Term Goals - 06/23/14 1016    PT SHORT TERM GOAL #1   Title independent with HEP   Time 2   Period Weeks   Status New           PT Long Term Goals - 06/23/14 1016    PT LONG TERM GOAL #1   Title decrease pain 50%   Time 8   Period Weeks   Status New   PT LONG TERM GOAL #2   Title increase ROM to WNL's   Time 8   Period Weeks   Status New    PT LONG TERM GOAL #3   Title increase grip strength to 45#   Time 8   Period Weeks   Status New               Plan - 06/23/14 1014    Clinical Impression Statement Patient with carpal tunnel right > left, does use the computer a lot at work.  Possibly due to edema of pregnancy, she is 8 months pregnant   Pt will benefit from skilled therapeutic intervention in order to improve on the following deficits Decreased range of motion;Impaired flexibility;Impaired UE functional use;Pain;Decreased mobility;Decreased strength   Rehab Potential Good   PT Frequency 2x / week   PT Duration 4 weeks   PT Treatment/Interventions Electrical Stimulation;Cryotherapy;Ultrasound;Moist Heat;ADLs/Self Care Home Management;Therapeutic activities;Therapeutic exercise;Patient/family education;Manual techniques   PT Next Visit Plan stretch, add exercises   Consulted and Agree with Plan of Care Patient         Problem List Patient Active Problem List   Diagnosis Date Noted  . Asthma 12/23/2011  . AR (allergic rhinitis) 12/23/2011    Jearld Lesch, PT 06/23/2014, 10:18 AM  [facility] [place] [address] [place], [place], [address] Phone: [phone]   Fax:  [phone]

## 2014-06-25 ENCOUNTER — Ambulatory Visit: Payer: BLUE CROSS/BLUE SHIELD | Admitting: Physical Therapy

## 2014-06-25 DIAGNOSIS — M25531 Pain in right wrist: Secondary | ICD-10-CM | POA: Diagnosis not present

## 2014-06-25 DIAGNOSIS — M25532 Pain in left wrist: Principal | ICD-10-CM

## 2014-06-25 NOTE — Therapy (Signed)
[facility] [place] [address] [place], [place], [address] Phone: [phone]   Fax:  [phone]  Physical Therapy Treatment  Patient Details  Name: Ashley Barnes MRN: [id number] Date of Birth: [birth year] Referring Provider:  Dois Davenport, MD  Encounter Date: 06/25/2014      PT End of Session - 06/25/14 1011    Visit Number 2   Date for PT Re-Evaluation 08/23/14   PT Start Time 0930   PT Stop Time 1008   PT Time Calculation (min) 38 min   Activity Tolerance Patient tolerated treatment well   Behavior During Therapy [facility] for tasks assessed/performed      Past Medical History  Diagnosis Date  . Obesity   . Allergy   . Asthma   . Recurrent sinusitis   . Anemia   . Abnormal Pap smear     12-29-09 ASCUS+HPV, 01-04-12 LSIL, 01-10-13 ASCUS +HPV  . Migraines     no aura  . Blood type O+     on donor card from [facility]    Past Surgical History  Procedure Laterality Date  . Wisdom tooth extraction    . Colposcopy      2011 & 2013 CIN1, 02-06-13     There were no vitals filed for this visit.  Visit Diagnosis:  Pain in both wrists      Subjective Assessment - 06/25/14 0932    Subjective hands/wrist still hurting   Limitations Lifting;House hold activities   Currently in Pain? Yes   Pain Score 4    Pain Location Wrist   Pain Orientation Right;Left   Pain Descriptors / Indicators Aching;Dull   Pain Type Acute pain   Pain Onset More than a month ago                         [facility] Adult PT Treatment/Exercise - 06/25/14 1009    Exercises   Exercises Wrist   Wrist Exercises   Other wrist exercises wrist flexors stretch 2x30 sec bil   Modalities   Modalities Ultrasound   Ultrasound   Ultrasound Location bil wrist   Ultrasound Parameters 50% DC; 3.47mHz; 1.0 w/cm2 x 8 min each wrist   Ultrasound Goals Edema;Pain   Manual Therapy   Manual Therapy Edema management   Edema  Management retrograde massage bil hands/wrist to help with fluid return                PT Education - 06/25/14 1010    Education provided Yes   Education Details retrograde massage   Person(s) Educated Patient   Methods Explanation;Demonstration   Comprehension Verbalized understanding          PT Short Term Goals - 06/23/14 1016    PT SHORT TERM GOAL #1   Title independent with HEP   Time 2   Period Weeks   Status New           PT Long Term Goals - 06/25/14 1011    PT LONG TERM GOAL #1   Title decrease pain 50%   Time 8   Period Weeks   Status On-going   PT LONG TERM GOAL #2   Title increase ROM to WNL's   Time 8   Period Weeks   Status On-going   PT LONG TERM GOAL #3   Title increase grip strength to 45#   Time 8   Period Weeks   Status  On-going               Plan - 06/25/14 1011    PT Next Visit Plan stretch, add exercises; cont manual and modalities PRN   Consulted and Agree with Plan of Care Patient        Problem List Patient Active Problem List   Diagnosis Date Noted  . Asthma 12/23/2011  . AR (allergic rhinitis) 12/23/2011   Clarita CraneStephanie F Jimmie Dattilio, PT, DPT 06/25/2014 10:12 AM  [facility] [address] [address] [place], [place], [address] Phone: [phone]   Fax:  [phone]

## 2014-07-02 ENCOUNTER — Ambulatory Visit: Payer: BLUE CROSS/BLUE SHIELD | Admitting: Physical Therapy

## 2014-07-02 DIAGNOSIS — M25532 Pain in left wrist: Principal | ICD-10-CM

## 2014-07-02 DIAGNOSIS — M25531 Pain in right wrist: Secondary | ICD-10-CM

## 2014-07-02 NOTE — Therapy (Signed)
[facility] [address] [place], [place], [address] Phone: [phone]   Fax:  [phone]  Physical Therapy Treatment  Patient Details  Name: Evert KohlGissel Roblero MRN: [id number] Date of Birth: 1986-02-14 Referring Provider:  Dois Davenportichter, Karen L, MD  Encounter Date: 07/02/2014      PT End of Session - 07/02/14 0929    Visit Number 3   Date for PT Re-Evaluation 08/23/14   PT Start Time 0848   PT Stop Time 0926   PT Time Calculation (min) 38 min   Activity Tolerance Patient tolerated treatment well   Behavior During Therapy [facility] for tasks assessed/performed      Past Medical History  Diagnosis Date  . Obesity   . Allergy   . Asthma   . Recurrent sinusitis   . Anemia   . Abnormal Pap smear     12-29-09 ASCUS+HPV, 01-04-12 LSIL, 01-10-13 ASCUS +HPV  . Migraines     no aura  . Blood type O+     on donor card from [facility]    Past Surgical History  Procedure Laterality Date  . Wisdom tooth extraction    . Colposcopy      2011 & 2013 CIN1, 02-06-13     There were no vitals filed for this visit.  Visit Diagnosis:  Pain in both wrists      Subjective Assessment - 07/02/14 0928    Subjective hands/wrist still hurting; hurts to pronate and flex on R wrist now too   Currently in Pain? Yes  did not rate today   Pain Location Abdomen   Pain Orientation Right;Left                         [facility] Adult PT Treatment/Exercise - 07/02/14 0923    Wrist Exercises   Other wrist exercises wrist flexors stretch 2x30 sec bil   Other wrist exercises wrist ext stretch R 2x30 sec; thumb ext stretch R 2x30 sec   Modalities   Modalities Ultrasound   Ultrasound   Ultrasound Location bil wrist   Ultrasound Parameters 50% DC, 3.3 mHz; 1.0 w/cm2 x 8 min each   Ultrasound Goals Edema;Pain                PT Education - 07/02/14 0929    Education provided Yes   Education Details stretches;  compression glove to help with fluid   Person(s) Educated Patient   Methods Explanation   Comprehension Verbalized understanding          PT Short Term Goals - 07/02/14 0933    PT SHORT TERM GOAL #1   Title independent with HEP   Status Achieved           PT Long Term Goals - 06/25/14 1011    PT LONG TERM GOAL #1   Title decrease pain 50%   Time 8   Period Weeks   Status On-going   PT LONG TERM GOAL #2   Title increase ROM to WNL's   Time 8   Period Weeks   Status On-going   PT LONG TERM GOAL #3   Title increase grip strength to 45#   Time 8   Period Weeks   Status On-going               Plan - 07/02/14 0929    Clinical Impression Statement Pt continues to experience pain and edema in bil wrists;  feel most symptoms will resolve once pt delivers baby.   PT Next Visit Plan stretch, add exercises; cont manual and modalities PRN   Consulted and Agree with Plan of Care Patient        Problem List Patient Active Problem List   Diagnosis Date Noted  . Asthma 12/23/2011  . AR (allergic rhinitis) 12/23/2011   Clarita Crane, PT, DPT 07/02/2014 9:35 AM  [facility] [address] [place] [address] [place], [place], [address] Phone: [phone]   Fax:  [phone]

## 2014-07-03 LAB — OB RESULTS CONSOLE GBS: STREP GROUP B AG: NEGATIVE

## 2014-07-04 ENCOUNTER — Ambulatory Visit: Payer: BLUE CROSS/BLUE SHIELD | Admitting: Physical Therapy

## 2014-07-04 DIAGNOSIS — M25531 Pain in right wrist: Secondary | ICD-10-CM

## 2014-07-04 DIAGNOSIS — M25532 Pain in left wrist: Principal | ICD-10-CM

## 2014-07-04 NOTE — Therapy (Signed)
[facility] [place] [facility] [address] [place], [place], [address] Phone: [phone]   Fax:  [phone]  Physical Therapy Treatment  Patient Details  Name: Ashley Barnes MRN: [id number] Date of Birth: [birth year] Referring Provider:  Dois Davenport, MD  Encounter Date: 07/04/2014      PT End of Session - 07/04/14 0921    Visit Number 4   PT Start Time 0855   PT Stop Time 0938   PT Time Calculation (min) 43 min      Past Medical History  Diagnosis Date  . Obesity   . Allergy   . Asthma   . Recurrent sinusitis   . Anemia   . Abnormal Pap smear     12-29-09 ASCUS+HPV, 01-04-12 LSIL, 01-10-13 ASCUS +HPV  . Migraines     no aura  . Blood type O+     on donor card from [facility]    Past Surgical History  Procedure Laterality Date  . Wisdom tooth extraction    . Colposcopy      2011 & 2013 CIN1, 02-06-13     There were no vitals filed for this visit.  Visit Diagnosis:  Pain in both wrists      Subjective Assessment - 07/04/14 0918    Subjective gloves are helping, RT hurts after pumping gas   Currently in Pain? Yes   Pain Score 6    Pain Location Wrist                         [facility] Adult PT Treatment/Exercise - 07/04/14 0001    Modalities   Modalities Ultrasound   Electrical Stimulation   Electrical Stimulation Location bilateral wrist   Electrical Stimulation Action premod   Electrical Stimulation Goals Edema;Pain   Ultrasound   Ultrasound Location bil wrist   Ultrasound Parameters 100 % cont   Ultrasound Goals Edema;Pain                PT Education - 07/04/14 0920    Education Details stretches,splints, gloves, ice and MH   Person(s) Educated Patient   Methods Explanation   Comprehension Verbalized understanding          PT Short Term Goals - 07/02/14 0933    PT SHORT TERM GOAL #1   Title independent with HEP   Status Achieved           PT Long Term  Goals - 06/25/14 1011    PT LONG TERM GOAL #1   Title decrease pain 50%   Time 8   Period Weeks   Status On-going   PT LONG TERM GOAL #2   Title increase ROM to WNL's   Time 8   Period Weeks   Status On-going   PT LONG TERM GOAL #3   Title increase grip strength to 45#   Time 8   Period Weeks   Status On-going               Plan - 07/04/14 2841    Clinical Impression Statement pt with good understanding of stretches and use of gloves and night splints. good understanding of increased fliud increases pain. pt allergic to cortisone so unable to try injections or ionto   PT Next Visit Plan assess premod stim        Problem List Patient Active Problem List   Diagnosis Date Noted  . Asthma 12/23/2011  . AR (allergic  rhinitis) 12/23/2011    Merrilee Ancona,ANGIE PTA 07/04/2014, 9:27 AM  [facility] [place] [address] [place], [place], [address] Phone: [phone]   Fax:  [phone]

## 2014-07-07 ENCOUNTER — Ambulatory Visit: Payer: BLUE CROSS/BLUE SHIELD | Attending: Obstetrics & Gynecology | Admitting: Physical Therapy

## 2014-07-07 DIAGNOSIS — M25531 Pain in right wrist: Secondary | ICD-10-CM | POA: Diagnosis not present

## 2014-07-07 DIAGNOSIS — M25532 Pain in left wrist: Secondary | ICD-10-CM | POA: Diagnosis not present

## 2014-07-07 NOTE — Therapy (Signed)
[facility] [place] [address] [place], [place], [address] Phone: [phone]   Fax:  [phone]  Physical Therapy Treatment  Patient Details  Name: Ashley Barnes MRN: [id number] Date of Birth: May 01, 1985 Referring Provider:  Essie Hart, MD  Encounter Date: 07/07/2014      PT End of Session - 07/07/14 0923    Visit Number 5   Date for PT Re-Evaluation 08/23/14   PT Start Time 0848   PT Stop Time 0936   PT Time Calculation (min) 48 min   Activity Tolerance Patient tolerated treatment well   Behavior During Therapy [facility] for tasks assessed/performed      Past Medical History  Diagnosis Date  . Obesity   . Allergy   . Asthma   . Recurrent sinusitis   . Anemia   . Abnormal Pap smear     12-29-09 ASCUS+HPV, 01-04-12 LSIL, 01-10-13 ASCUS +HPV  . Migraines     no aura  . Blood type O+     on donor card from [facility]    Past Surgical History  Procedure Laterality Date  . Wisdom tooth extraction    . Colposcopy      2011 & 2013 CIN1, 02-06-13     There were no vitals filed for this visit.  Visit Diagnosis:  Pain in both wrists      Subjective Assessment - 07/07/14 0850    Subjective [redacted] weeks pregnant today; estim helped for the day.  Gloves are working.   Currently in Pain? Yes   Pain Score 4    Pain Location Wrist   Pain Orientation Right;Left   Pain Descriptors / Indicators Aching;Dull   Pain Type Acute pain                         [facility] Adult PT Treatment/Exercise - 07/07/14 0001    Modalities   Modalities Electrical Stimulation;Moist Heat;Ultrasound   Moist Heat Therapy   Number Minutes Moist Heat 15 Minutes   Moist Heat Location Wrist  bil   Electrical Stimulation   Electrical Stimulation Location bilateral wrist   Electrical Stimulation Action premod   Electrical Stimulation Parameters to tolerance   Electrical Stimulation Goals Edema;Pain   Ultrasound   Ultrasound  Location bil wrist   Ultrasound Parameters 100% DC; 1.0 w/cm2; 3/3 mHz x 6 min each   Ultrasound Goals Edema;Pain                  PT Short Term Goals - 07/02/14 0933    PT SHORT TERM GOAL #1   Title independent with HEP   Status Achieved           PT Long Term Goals - 06/25/14 1011    PT LONG TERM GOAL #1   Title decrease pain 50%   Time 8   Period Weeks   Status On-going   PT LONG TERM GOAL #2   Title increase ROM to WNL's   Time 8   Period Weeks   Status On-going   PT LONG TERM GOAL #3   Title increase grip strength to 45#   Time 8   Period Weeks   Status On-going               Plan - 07/07/14 9528    Clinical Impression Statement Pt with good response to estim x 1 day with return of pain over weekend.  Continues to use  splints and gloves to help control fluid increase.  Will continue to benefit from PT to decrease pain and improve function.   PT Next Visit Plan continue per POC   Consulted and Agree with Plan of Care Patient        Problem List Patient Active Problem List   Diagnosis Date Noted  . Asthma 12/23/2011  . AR (allergic rhinitis) 12/23/2011   Clarita CraneStephanie F Baruch Lewers, PT, DPT 07/07/2014 10:08 AM  [facility] [address] [place] [address] [place], [place], [address] Phone: [phone]   Fax:  [phone]

## 2014-07-11 ENCOUNTER — Encounter: Payer: Self-pay | Admitting: Physical Therapy

## 2014-07-11 ENCOUNTER — Ambulatory Visit: Payer: BLUE CROSS/BLUE SHIELD | Admitting: Physical Therapy

## 2014-07-11 DIAGNOSIS — M25531 Pain in right wrist: Secondary | ICD-10-CM

## 2014-07-11 DIAGNOSIS — M25532 Pain in left wrist: Principal | ICD-10-CM

## 2014-07-11 NOTE — Therapy (Signed)
[facility] [address] [place], [place], [address] Phone: [phone]   Fax:  [phone]  Physical Therapy Treatment  Patient Details  Name: Ashley Barnes MRN: [id number] Date of Birth: October 08, 1985 Referring Provider:  Essie HartPinn, Walda, MD  Encounter Date: 07/11/2014      PT End of Session - 07/11/14 1053    Visit Number 6   PT Start Time 0845   PT Stop Time 0935   PT Time Calculation (min) 50 min   Activity Tolerance Patient tolerated treatment well   Behavior During Therapy [facility] for tasks assessed/performed      Past Medical History  Diagnosis Date   Obesity    Allergy    Asthma    Recurrent sinusitis    Anemia    Abnormal Pap smear     12-29-09 ASCUS+HPV, 01-04-12 LSIL, 01-10-13 ASCUS +HPV   Migraines     no aura   Blood type O+     on donor card from [facility]    Past Surgical History  Procedure Laterality Date   Wisdom tooth extraction     Colposcopy      2011 & 2013 CIN1, 02-06-13     There were no vitals filed for this visit.  Visit Diagnosis:  Pain in both wrists      Subjective Assessment - 07/11/14 0944    Subjective No change.  Continues with bilateral carpal tunnel type pain.     Currently in Pain? Yes   Pain Score 4    Pain Location Wrist   Pain Orientation Right;Left   Pain Descriptors / Indicators Aching;Dull   Pain Type Acute pain   Pain Onset More than a month ago   Pain Frequency Constant                         [facility] Adult PT Treatment/Exercise - 07/11/14 0001    Wrist Exercises   Other wrist exercises wrist flexors stretch 2x30 sec bil   Other wrist exercises wrist ext stretch R 2x30 sec; thumb ext stretch R 2x30 sec   Modalities   Modalities Electrical Stimulation;Moist Heat;Ultrasound   Moist Heat Therapy   Number Minutes Moist Heat 15 Minutes   Moist Heat Location Wrist   Electrical Stimulation   Electrical Stimulation Location  bilateral wrist   Electrical Stimulation Action premod   Electrical Stimulation Goals Edema;Pain   Ultrasound   Ultrasound Location bil wrist   Ultrasound Parameters 100% 3.3 mHz. @ 1.2w/cm2   Ultrasound Goals Edema;Pain                  PT Short Term Goals - 07/11/14 1055    PT SHORT TERM GOAL #1   Title independent with HEP           PT Long Term Goals - 06/25/14 1011    PT LONG TERM GOAL #1   Title decrease pain 50%   Time 8   Period Weeks   Status On-going   PT LONG TERM GOAL #2   Title increase ROM to WNL's   Time 8   Period Weeks   Status On-going   PT LONG TERM GOAL #3   Title increase grip strength to 45#   Time 8   Period Weeks   Status On-going               Plan - 07/11/14 1054    Clinical Impression  Statement reports feeling better with E-stim and ultrasound   Pt will benefit from skilled therapeutic intervention in order to improve on the following deficits Decreased range of motion;Impaired flexibility;Impaired UE functional use;Decreased strength;Decreased mobility   Rehab Potential Good   PT Frequency 2x / week   PT Duration 4 weeks   PT Treatment/Interventions Electrical Stimulation;Moist Heat;Ultrasound;ADLs/Self Care Home Management;Therapeutic activities;Therapeutic exercise;Patient/family education   PT Next Visit Plan continue per POC        Problem List Patient Active Problem List   Diagnosis Date Noted   Asthma 12/23/2011   AR (allergic rhinitis) 12/23/2011    Tomie ChinaLarry C Clements, PTA 07/11/2014, 10:59 AM  [facility] [address] [place], [place], [address] Phone: [phone]   Fax:  [phone]

## 2014-07-16 ENCOUNTER — Encounter (HOSPITAL_COMMUNITY): Payer: Self-pay | Admitting: *Deleted

## 2014-07-16 ENCOUNTER — Inpatient Hospital Stay (HOSPITAL_COMMUNITY)
Admission: AD | Admit: 2014-07-16 | Discharge: 2014-07-16 | Disposition: A | Discharge status: Home / Self Care | Payer: BLUE CROSS/BLUE SHIELD | Source: Ambulatory Visit | Attending: Obstetrics & Gynecology | Admitting: Obstetrics & Gynecology

## 2014-07-16 DIAGNOSIS — Z3A38 38 weeks gestation of pregnancy: Secondary | ICD-10-CM | POA: Insufficient documentation

## 2014-07-16 NOTE — MAU Note (Signed)
Pt to walk for 2 hours and then be reexamined.

## 2014-07-16 NOTE — Discharge Instructions (Signed)
Third Trimester of Pregnancy The third trimester is from week 29 through week 42, months 7 through 9. The third trimester is a time when the fetus is growing rapidly. At the end of the ninth month, the fetus is about 20 inches in length and weighs 6-10 pounds.  BODY CHANGES Your body goes through many changes during pregnancy. The changes vary from woman to woman.   Your weight will continue to increase. You can expect to gain 25-35 pounds (11-16 kg) by the end of the pregnancy.  You may begin to get stretch marks on your hips, abdomen, and breasts.  You may urinate more often because the fetus is moving lower into your pelvis and pressing on your bladder.  You may develop or continue to have heartburn as a result of your pregnancy.  You may develop constipation because certain hormones are causing the muscles that push waste through your intestines to slow down.  You may develop hemorrhoids or swollen, bulging veins (varicose veins).  You may have pelvic pain because of the weight gain and pregnancy hormones relaxing your joints between the bones in your pelvis. Backaches may result from overexertion of the muscles supporting your posture.  You may have changes in your hair. These can include thickening of your hair, rapid growth, and changes in texture. Some women also have hair loss during or after pregnancy, or hair that feels dry or thin. Your hair will most likely return to normal after your baby is born.  Your breasts will continue to grow and be tender. A yellow discharge may leak from your breasts called colostrum.  Your belly button may stick out.  You may feel short of breath because of your expanding uterus.  You may notice the fetus "dropping," or moving lower in your abdomen.  You may have a bloody mucus discharge. This usually occurs a few days to a week before labor begins.  Your cervix becomes thin and soft (effaced) near your due date. WHAT TO EXPECT AT YOUR PRENATAL  EXAMS  You will have prenatal exams every 2 weeks until week 36. Then, you will have weekly prenatal exams. During a routine prenatal visit:  You will be weighed to make sure you and the fetus are growing normally.  Your blood pressure is taken.  Your abdomen will be measured to track your baby's growth.  The fetal heartbeat will be listened to.  Any test results from the previous visit will be discussed.  You may have a cervical check near your due date to see if you have effaced. At around 36 weeks, your caregiver will check your cervix. At the same time, your caregiver will also perform a test on the secretions of the vaginal tissue. This test is to determine if a type of bacteria, Group B streptococcus, is present. Your caregiver will explain this further. Your caregiver may ask you:  What your birth plan is.  How you are feeling.  If you are feeling the baby move.  If you have had any abnormal symptoms, such as leaking fluid, bleeding, severe headaches, or abdominal cramping.  If you have any questions. Other tests or screenings that may be performed during your third trimester include:  Blood tests that check for low iron levels (anemia).  Fetal testing to check the health, activity level, and growth of the fetus. Testing is done if you have certain medical conditions or if there are problems during the pregnancy. FALSE LABOR You may feel small, irregular contractions that   eventually go away. These are called Braxton Hicks contractions, or false labor. Contractions may last for hours, days, or even weeks before true labor sets in. If contractions come at regular intervals, intensify, or become painful, it is best to be seen by your caregiver.  SIGNS OF LABOR   Menstrual-like cramps.  Contractions that are 5 minutes apart or less.  Contractions that start on the top of the uterus and spread down to the lower abdomen and back.  A sense of increased pelvic pressure or back  pain.  A watery or bloody mucus discharge that comes from the vagina. If you have any of these signs before the 37th week of pregnancy, call your caregiver right away. You need to go to the hospital to get checked immediately. HOME CARE INSTRUCTIONS   Avoid all smoking, herbs, alcohol, and unprescribed drugs. These chemicals affect the formation and growth of the baby.  Follow your caregiver's instructions regarding medicine use. There are medicines that are either safe or unsafe to take during pregnancy.  Exercise only as directed by your caregiver. Experiencing uterine cramps is a good sign to stop exercising.  Continue to eat regular, healthy meals.  Wear a good support bra for breast tenderness.  Do not use hot tubs, steam rooms, or saunas.  Wear your seat belt at all times when driving.  Avoid raw meat, uncooked cheese, cat litter boxes, and soil used by cats. These carry germs that can cause birth defects in the baby.  Take your prenatal vitamins.  Try taking a stool softener (if your caregiver approves) if you develop constipation. Eat more high-fiber foods, such as fresh vegetables or fruit and whole grains. Drink plenty of fluids to keep your urine clear or pale yellow.  Take warm sitz baths to soothe any pain or discomfort caused by hemorrhoids. Use hemorrhoid cream if your caregiver approves.  If you develop varicose veins, wear support hose. Elevate your feet for 15 minutes, 3-4 times a day. Limit salt in your diet.  Avoid heavy lifting, wear low heal shoes, and practice good posture.  Rest a lot with your legs elevated if you have leg cramps or low back pain.  Visit your dentist if you have not gone during your pregnancy. Use a soft toothbrush to brush your teeth and be gentle when you floss.  A sexual relationship may be continued unless your caregiver directs you otherwise.  Do not travel far distances unless it is absolutely necessary and only with the approval  of your caregiver.  Take prenatal classes to understand, practice, and ask questions about the labor and delivery.  Make a trial run to the hospital.  Pack your hospital bag.  Prepare the baby's nursery.  Continue to go to all your prenatal visits as directed by your caregiver. SEEK MEDICAL CARE IF:  You are unsure if you are in labor or if your water has broken.  You have dizziness.  You have mild pelvic cramps, pelvic pressure, or nagging pain in your abdominal area.  You have persistent nausea, vomiting, or diarrhea.  You have a bad smelling vaginal discharge.  You have pain with urination. SEEK IMMEDIATE MEDICAL CARE IF:   You have a fever.  You are leaking fluid from your vagina.  You have spotting or bleeding from your vagina.  You have severe abdominal cramping or pain.  You have rapid weight loss or gain.  You have shortness of breath with chest pain.  You notice sudden or extreme swelling   of your face, hands, ankles, feet, or legs.  You have not felt your baby move in over an hour.  You have severe headaches that do not go away with medicine.  You have vision changes. Document Released: 02/15/2001 Document Revised: 02/26/2013 Document Reviewed: 04/24/2012 ExitCare Patient Information 2015 [employer]. This information is not intended to replace advice given to you by your health care provider. Make sure you discuss any questions you have with your health care provider.  

## 2014-07-16 NOTE — MAU Note (Signed)
Patient may be discharged to home with instructions.

## 2014-07-17 ENCOUNTER — Ambulatory Visit: Payer: BLUE CROSS/BLUE SHIELD | Admitting: Physical Therapy

## 2014-07-17 ENCOUNTER — Inpatient Hospital Stay (HOSPITAL_COMMUNITY)
Admission: AD | Admit: 2014-07-17 | Discharge: 2014-07-17 | Disposition: A | Discharge status: Home / Self Care | Payer: BLUE CROSS/BLUE SHIELD | Source: Ambulatory Visit | Attending: Obstetrics | Admitting: Obstetrics

## 2014-07-17 ENCOUNTER — Encounter (HOSPITAL_COMMUNITY): Payer: Self-pay | Admitting: *Deleted

## 2014-07-17 DIAGNOSIS — Z3A37 37 weeks gestation of pregnancy: Secondary | ICD-10-CM | POA: Diagnosis not present

## 2014-07-17 LAB — URINALYSIS, ROUTINE W REFLEX MICROSCOPIC
Bilirubin Urine: NEGATIVE
GLUCOSE, UA: NEGATIVE mg/dL
Ketones, ur: NEGATIVE mg/dL
NITRITE: NEGATIVE
PH: 5 (ref 5.0–8.0)
Protein, ur: NEGATIVE mg/dL
Urobilinogen, UA: 0.2 mg/dL (ref 0.0–1.0)

## 2014-07-17 LAB — URINE MICROSCOPIC-ADD ON

## 2014-07-17 NOTE — MAU Note (Deleted)
Pt reports she had a gush of fluid at 1030 and 1830, having back pain and vaginal pain now.

## 2014-07-17 NOTE — MAU Note (Signed)
In MAU 5/11 for labor check. 3cm dilated at discharge. Today says she is having brown discharge/bleeding. Contractions irregular.Denies bright red vaginal bleeding.  Positive fetal movement Denies SROM/LOF  Denies any infections/complications of pregnancy

## 2014-07-25 ENCOUNTER — Ambulatory Visit: Payer: BLUE CROSS/BLUE SHIELD | Admitting: Rehabilitation

## 2014-07-25 DIAGNOSIS — M25531 Pain in right wrist: Secondary | ICD-10-CM | POA: Diagnosis not present

## 2014-07-25 DIAGNOSIS — M25532 Pain in left wrist: Principal | ICD-10-CM

## 2014-07-25 NOTE — Therapy (Signed)
[facility] [address] [place], [place], [address] Phone: [phone]   Fax:  [phone]  Physical Therapy Treatment  Patient Details  Name: Ashley KohlGissel Barnes MRN: [id number] Date of Birth: August 12, 1985 Referring Provider:  Essie HartPinn, Walda, MD  Encounter Date: 07/25/2014      PT End of Session - 07/25/14 1134    Visit Number 7   PT Start Time 0850   PT Stop Time 0930   PT Time Calculation (min) 40 min   Activity Tolerance Patient tolerated treatment well      Past Medical History  Diagnosis Date  . Obesity   . Allergy   . Asthma   . Recurrent sinusitis   . Anemia   . Abnormal Pap smear     12-29-09 ASCUS+HPV, 01-04-12 LSIL, 01-10-13 ASCUS +HPV  . Migraines     no aura  . Blood type O+     on donor card from [facility]    Past Surgical History  Procedure Laterality Date  . Wisdom tooth extraction    . Colposcopy      2011 & 2013 CIN1, 02-06-13     There were no vitals filed for this visit.  Visit Diagnosis:  Pain in both wrists      Subjective Assessment - 07/25/14 1055    Subjective no changes.   States had BP checked at MD this week and auto cuff couldn't get reading and her hands turned white and stayed numb for a long time.     Currently in Pain? Yes   Pain Score 3    Pain Descriptors / Indicators Constant;Numbness;Tingling   Pain Type Acute pain   Aggravating Factors  still c/o pain into bilat thumb/hand area along CT distribution   Effect of Pain on Daily Activities difficulty with typing   Multiple Pain Sites Yes                         [facility] Adult PT Treatment/Exercise - 07/25/14 0001    Ultrasound   Ultrasound Location bil wrist   Ultrasound Parameters 100% 3mhz x 1.2 w/cm2 x 8' each wrist   Ultrasound Goals Pain   Manual Therapy   Manual therapy comments myofascial rls, traction, grade 3 -4 wrist ext jt mobs to R wrist only x 15'   Edema Management kineseotape  bilat wrist 75% stretch                PT Education - 07/25/14 1132    Education provided Yes   Education Details talk with MD about diuretic and prednisone once baby is born and done w/ breastfeeding   Person(s) Educated Patient   Methods Explanation   Comprehension Verbalized understanding          PT Short Term Goals - 07/11/14 1055    PT SHORT TERM GOAL #1   Title independent with HEP           PT Long Term Goals - 06/25/14 1011    PT LONG TERM GOAL #1   Title decrease pain 50%   Time 8   Period Weeks   Status On-going   PT LONG TERM GOAL #2   Title increase ROM to WNL's   Time 8   Period Weeks   Status On-going   PT LONG TERM GOAL #3   Title increase grip strength to 45#   Time 8   Period Weeks  Status On-going               Plan - 07/25/14 1138    Clinical Impression Statement States repeated BP checks on both arms multiple times (due to no reading) has put her in a lot of pain and numbness.      Rehab Potential Good   PT Next Visit Plan reassess if kineseotape and MFR helped vs. estim and do whichever helped more or both if she feels both are beneficial   PT Home Exercise Plan continue prayer stretches   Consulted and Agree with Plan of Care Patient        Problem List Patient Active Problem List   Diagnosis Date Noted  . Asthma 12/23/2011  . AR (allergic rhinitis) 12/23/2011    Ria BushSANDERSON,Zonya Gudger, PT  07/25/2014, 11:49 AM  [facility] [address] [place], [place], [address] Phone: [phone]   Fax:  [phone]

## 2014-07-28 ENCOUNTER — Ambulatory Visit: Payer: BLUE CROSS/BLUE SHIELD | Admitting: Physical Therapy

## 2014-07-28 ENCOUNTER — Inpatient Hospital Stay (HOSPITAL_COMMUNITY): Payer: BLUE CROSS/BLUE SHIELD | Admitting: Anesthesiology

## 2014-07-28 ENCOUNTER — Encounter (HOSPITAL_COMMUNITY): Payer: Self-pay | Admitting: *Deleted

## 2014-07-28 ENCOUNTER — Inpatient Hospital Stay (HOSPITAL_COMMUNITY)
Admission: AD | Admit: 2014-07-28 | Discharge: 2014-07-31 | DRG: 775 | Disposition: A | Discharge status: Home / Self Care | Payer: BLUE CROSS/BLUE SHIELD | Source: Ambulatory Visit | Attending: Obstetrics and Gynecology | Admitting: Obstetrics and Gynecology

## 2014-07-28 DIAGNOSIS — Z3A39 39 weeks gestation of pregnancy: Secondary | ICD-10-CM | POA: Diagnosis present

## 2014-07-28 DIAGNOSIS — E669 Obesity, unspecified: Secondary | ICD-10-CM | POA: Diagnosis present

## 2014-07-28 DIAGNOSIS — O139 Gestational [pregnancy-induced] hypertension without significant proteinuria, unspecified trimester: Secondary | ICD-10-CM

## 2014-07-28 DIAGNOSIS — O133 Gestational [pregnancy-induced] hypertension without significant proteinuria, third trimester: Principal | ICD-10-CM | POA: Diagnosis present

## 2014-07-28 DIAGNOSIS — Z6841 Body Mass Index (BMI) 40.0 and over, adult: Secondary | ICD-10-CM

## 2014-07-28 LAB — URINALYSIS, ROUTINE W REFLEX MICROSCOPIC
Bilirubin Urine: NEGATIVE
Glucose, UA: NEGATIVE mg/dL
HGB URINE DIPSTICK: NEGATIVE
Ketones, ur: NEGATIVE mg/dL
Leukocytes, UA: NEGATIVE
Nitrite: NEGATIVE
Protein, ur: NEGATIVE mg/dL
SPECIFIC GRAVITY, URINE: 1.01 (ref 1.005–1.030)
Urobilinogen, UA: 0.2 mg/dL (ref 0.0–1.0)
pH: 5 (ref 5.0–8.0)

## 2014-07-28 LAB — COMPREHENSIVE METABOLIC PANEL
ALK PHOS: 120 U/L (ref 38–126)
ALT: 13 U/L — ABNORMAL LOW (ref 14–54)
AST: 18 U/L (ref 15–41)
Albumin: 3.1 g/dL — ABNORMAL LOW (ref 3.5–5.0)
Anion gap: 8 (ref 5–15)
BUN: 7 mg/dL (ref 6–20)
CO2: 23 mmol/L (ref 22–32)
Calcium: 9.3 mg/dL (ref 8.9–10.3)
Chloride: 104 mmol/L (ref 101–111)
Creatinine, Ser: 0.6 mg/dL (ref 0.44–1.00)
GFR calc Af Amer: >60 mL/min (ref >60)
GFR calc non Af Amer: >60 mL/min (ref >60)
Glucose, Bld: 87 mg/dL (ref 65–99)
Potassium: 3.8 mmol/L (ref 3.5–5.1)
SODIUM: 135 mmol/L (ref 135–145)
Total Bilirubin: 0.1 mg/dL — ABNORMAL LOW (ref 0.3–1.2)
Total Protein: 6.9 g/dL (ref 6.5–8.1)

## 2014-07-28 LAB — LACTATE DEHYDROGENASE: LDH: 132 U/L (ref 98–192)

## 2014-07-28 LAB — TYPE AND SCREEN
ABO/RH(D): O POS
ANTIBODY SCREEN: NEGATIVE

## 2014-07-28 LAB — PROTEIN / CREATININE RATIO, URINE
Creatinine, Urine: 43 mg/dL
Total Protein, Urine: <6 mg/dL

## 2014-07-28 LAB — CBC
HEMATOCRIT: 35.3 % — AB (ref 36.0–46.0)
HEMOGLOBIN: 12.2 g/dL (ref 12.0–15.0)
MCH: 28 pg (ref 26.0–34.0)
MCHC: 34.6 g/dL (ref 30.0–36.0)
MCV: 81 fL (ref 78.0–100.0)
PLATELETS: 206 10*3/uL (ref 150–400)
RBC: 4.36 MIL/uL (ref 3.87–5.11)
RDW: 15.1 % (ref 11.5–15.5)
WBC: 9.4 10*3/uL (ref 4.0–10.5)

## 2014-07-28 LAB — URIC ACID: Uric Acid, Serum: 5.4 mg/dL (ref 2.3–6.6)

## 2014-07-28 MED ORDER — OXYCODONE-ACETAMINOPHEN 5-325 MG PO TABS: 1.0000 | ORAL_TABLET | ORAL | Status: DC | PRN

## 2014-07-28 MED ORDER — FENTANYL 2.5 MCG/ML BUPIVACAINE 1/10 % EPIDURAL INFUSION (WH - ANES): 12.0000 mL/h | INTRAMUSCULAR | Status: DC | PRN

## 2014-07-28 MED ORDER — CITRIC ACID-SODIUM CITRATE 334-500 MG/5ML PO SOLN: 30.0000 mL | ORAL | Status: DC | PRN

## 2014-07-28 MED ORDER — LIDOCAINE HCL (PF) 1 % IJ SOLN
30.0000 mL | INTRAMUSCULAR | Status: DC | PRN
  Filled 2014-07-28: qty 30

## 2014-07-28 MED ORDER — LIDOCAINE HCL (PF) 1 % IJ SOLN
INTRAMUSCULAR | Status: DC | PRN
  Administered 2014-07-28: 3 mL
  Administered 2014-07-28: 4 mL

## 2014-07-28 MED ORDER — ACETAMINOPHEN 325 MG PO TABS
650.0000 mg | ORAL_TABLET | ORAL | Status: DC | PRN
  Administered 2014-07-28: 650 mg via ORAL
  Filled 2014-07-28: qty 2

## 2014-07-28 MED ORDER — DIPHENHYDRAMINE HCL 50 MG/ML IJ SOLN: 12.5000 mg | INTRAMUSCULAR | Status: DC | PRN

## 2014-07-28 MED ORDER — PHENYLEPHRINE 40 MCG/ML (10ML) SYRINGE FOR IV PUSH (FOR BLOOD PRESSURE SUPPORT): 80.0000 ug | PREFILLED_SYRINGE | INTRAVENOUS | Status: DC | PRN

## 2014-07-28 MED ORDER — LACTATED RINGERS IV SOLN
INTRAVENOUS | Status: DC
  Administered 2014-07-28 – 2014-07-29 (×4): via INTRAVENOUS

## 2014-07-28 MED ORDER — BUTORPHANOL TARTRATE 1 MG/ML IJ SOLN: 1.0000 mg | INTRAMUSCULAR | Status: DC | PRN

## 2014-07-28 MED ORDER — EPHEDRINE 5 MG/ML INJ
10.0000 mg | INTRAVENOUS | Status: AC | PRN
  Administered 2014-07-28 (×2): 10 mg via INTRAVENOUS

## 2014-07-28 MED ORDER — FLEET ENEMA 7-19 GM/118ML RE ENEM: 1.0000 | ENEMA | Freq: Once | RECTAL | Status: DC

## 2014-07-28 MED ORDER — OXYTOCIN 40 UNITS IN LACTATED RINGERS INFUSION - SIMPLE MED
1.0000 m[IU]/min | INTRAVENOUS | Status: DC
  Administered 2014-07-28: 2 m[IU]/min via INTRAVENOUS
  Filled 2014-07-28: qty 1000

## 2014-07-28 MED ORDER — LACTATED RINGERS IV SOLN
500.0000 mL | INTRAVENOUS | Status: DC | PRN
  Administered 2014-07-28: 250 mL via INTRAVENOUS
  Administered 2014-07-29: 500 mL via INTRAVENOUS

## 2014-07-28 MED ORDER — ONDANSETRON HCL 4 MG/2ML IJ SOLN
4.0000 mg | Freq: Four times a day (QID) | INTRAMUSCULAR | Status: DC | PRN
Start: 2014-07-28 — End: 2014-07-29

## 2014-07-28 MED ORDER — FENTANYL 2.5 MCG/ML BUPIVACAINE 1/10 % EPIDURAL INFUSION (WH - ANES)
14.0000 mL/h | INTRAMUSCULAR | Status: DC | PRN
  Administered 2014-07-28: 14 mL/h via EPIDURAL
  Administered 2014-07-28: 12 mL/h via EPIDURAL
  Administered 2014-07-28 – 2014-07-29 (×2): 14 mL/h via EPIDURAL
  Filled 2014-07-28 (×3): qty 125

## 2014-07-28 MED ORDER — OXYTOCIN BOLUS FROM INFUSION
500.0000 mL | INTRAVENOUS | Status: DC
  Administered 2014-07-29: 500 mL via INTRAVENOUS

## 2014-07-28 MED ORDER — OXYCODONE-ACETAMINOPHEN 5-325 MG PO TABS: 2.0000 | ORAL_TABLET | ORAL | Status: DC | PRN

## 2014-07-28 MED ORDER — OXYTOCIN 40 UNITS IN LACTATED RINGERS INFUSION - SIMPLE MED: 62.5000 mL/h | INTRAVENOUS | Status: DC

## 2014-07-28 MED ORDER — PHENYLEPHRINE 40 MCG/ML (10ML) SYRINGE FOR IV PUSH (FOR BLOOD PRESSURE SUPPORT)
80.0000 ug | PREFILLED_SYRINGE | INTRAVENOUS | Status: DC | PRN
  Filled 2014-07-28: qty 20
  Filled 2014-07-28: qty 2
  Filled 2014-07-28: qty 20

## 2014-07-28 MED ORDER — TERBUTALINE SULFATE 1 MG/ML IJ SOLN: 0.2500 mg | Freq: Once | INTRAMUSCULAR | Status: AC | PRN

## 2014-07-28 NOTE — MAU Note (Signed)
Pt. Urine sent to lab  

## 2014-07-28 NOTE — H&P (Signed)
Ashley Barnes is a 29 y.o. female presenting for induction of labor from office for elevated BPs mild range 140's / 90's. Patient reports worsening headache, no scotomata or blurry vision, no RUQ pain.   She notes back pain, irregular ctx, no vaginal bleeding no LOF.  She notes decreased FM.   Maternal Medical History:  Reason for admission: Contractions.  Nausea. Elevated BPs and headache  Contractions: Onset was 3-5 hours ago.   Frequency: irregular.   Duration is approximately 60 seconds.   Perceived severity is moderate.    Fetal activity: Perceived fetal activity is normal.   Last perceived fetal movement was within the past 12 hours.    Prenatal complications: PIH.   Prenatal Complications - Diabetes: none.    OB History    Gravida Para Term Preterm AB TAB SAB Ectopic Multiple Living   1 0 0 0 0 0 0 0 0 0      Past Medical History  Diagnosis Date  . Obesity   . Allergy   . Asthma   . Recurrent sinusitis   . Anemia   . Abnormal Pap smear     12-29-09 ASCUS+HPV, 01-04-12 LSIL, 01-10-13 ASCUS +HPV  . Migraines     no aura  . Blood type O+     on donor card from [facility]   Past Surgical History  Procedure Laterality Date  . Wisdom tooth extraction    . Colposcopy      2011 & 2013 CIN1, 02-06-13    Family History: family history includes Cancer in her paternal grandfather; Diabetes (age of onset: 1655) in her mother; Heart disease (age of onset: 4534) in her father; Hypertension in her father and mother. Social History:  reports that she has never smoked. She has never used smokeless tobacco. She reports that she does not drink alcohol or use illicit drugs.   Prenatal Transfer Tool  Maternal Diabetes: No Genetic Screening: Normal Maternal Ultrasounds/Referrals: Normal Fetal Ultrasounds or other Referrals:  Other:   Anatomy scan normal Maternal Substance Abuse:  No Significant Maternal Medications:  None Significant Maternal Lab Results:  Lab values  include: Group B Strep negative Other Comments:  None  Review of Systems  Constitutional: Negative for fever.  Eyes: Negative for blurred vision and double vision.  Cardiovascular: Negative for chest pain.  Gastrointestinal: Negative for heartburn and nausea.  Genitourinary: Negative for dysuria and urgency.  Skin: Negative for rash.  Neurological: Positive for headaches. Negative for tingling.  Endo/Heme/Allergies: Negative for environmental allergies. Does not bruise/bleed easily.  Psychiatric/Behavioral: Negative for depression.    Dilation: 3 Effacement (%): 70 Station: -3 Exam by:: A. Gagliardo, RN Blood pressure 147/82, pulse 81, temperature 98.3 F (36.8 C), temperature source Oral, resp. rate 18, height 5' (1.524 m), weight 129.275 kg (285 lb), last menstrual period 10/20/2013. Maternal Exam:  Uterine Assessment: Contraction strength is moderate.  Contraction duration is 60 seconds. Contraction frequency is irregular.   Abdomen: Fundal height is 39 cm.   Estimated fetal weight is 3000 grams.   Fetal presentation: vertex  Introitus: Normal vulva. Normal vagina.  Ferning test: not done.  Nitrazine test: not done. Amniotic fluid character: not assessed.  Pelvis: adequate for delivery.   Cervix: Cervix evaluated by digital exam.   3/ 50/-2 in office  Fetal Exam Fetal Monitor Review: Mode: hand-held doppler probe.   Baseline rate: 140.  Variability: moderate (6-25 bpm).   Pattern: accelerations present and no decelerations.    Fetal State Assessment: Category  I - tracings are normal.     Physical Exam  Vitals reviewed. Constitutional: She is oriented to person, place, and time. She appears well-developed and well-nourished.  HENT:  Head: Normocephalic.  Eyes: Pupils are equal, round, and reactive to light.  Neck: Normal range of motion.  Cardiovascular: Normal rate and regular rhythm.   GI: Soft.  Neurological: She is alert and oriented to person, place, and  time. She has normal reflexes.  Skin: Skin is warm.    Prenatal labs: ABO, Rh: --/--/O POS (09/25 1538) Antibody:  neg Rubella:   Immune RPR:   NR HBsAg:   Neg HIV:   Neg GBS:   Neg  Assessment/Plan: 29 yo G1P0 at 39 weeks 3 days with Gestational hypertension Admit to L&D Induction of labor with pitocin Continuous monitoring Pre-E labs already drawn and were negative in MAU Epidural on demand    Ashley Barnes 07/28/2014, 1:57 PM

## 2014-07-28 NOTE — MAU Provider Note (Signed)
History     CSN: [id number]  Arrival date and time: 07/28/14 1120   First Provider Initiated Contact with Patient 07/28/14 1236      Chief Complaint  Patient presents with  . Hypertension   HPI Ashley Barnes is 29 y.o. G1P0000 [redacted]w[redacted]d weeks presenting for evaluation of blood pressure and headache after being seen in the office today.  She is a patient of Dr. Ophelia Charter.  Began with headache without visual changes, this am, she hasn;t taken anything for it. Began with swelling in her legs last week.  Neg for chest pain.    She reports uncomplicated pregnancy until pressure elevated today.     Past Medical History  Diagnosis Date  . Obesity   . Allergy   . Asthma   . Recurrent sinusitis   . Anemia   . Abnormal Pap smear     12-29-09 ASCUS+HPV, 01-04-12 LSIL, 01-10-13 ASCUS +HPV  . Migraines     no aura  . Blood type O+     on donor card from [facility]    Past Surgical History  Procedure Laterality Date  . Wisdom tooth extraction    . 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 Diabetes Mother 75  . Hypertension Mother   . Heart disease Father 69    AMI x 3  . Hypertension Father   . Cancer Paternal Grandfather     colon    History  Substance Use Topics  . Smoking status: Never Smoker   . Smokeless tobacco: Never Used  . Alcohol Use: No    Allergies:  Allergies  Allergen Reactions  . Corticosteroids Anaphylaxis and Hives  . Penicillins Anaphylaxis  . Zocor [Simvastatin - High Dose] Hives and Rash    Prescriptions prior to admission  Medication Sig Dispense Refill Last Dose  . acetaminophen (TYLENOL) 325 MG tablet Take 650 mg by mouth every 6 (six) hours as needed for headache.   07/17/2014 at Unknown time  . B Complex-C (B-COMPLEX WITH VITAMIN C) tablet Take 1 tablet by mouth every morning.   Past Week at Unknown time  . Prenatal Vit-Fe Fumarate-FA (PRENATAL MULTIVITAMIN) TABS tablet Take 1 tablet by  mouth daily at 12 noon.   07/17/2014 at Unknown time    Review of Systems  Constitutional: Negative for fever and chills.  Eyes: Negative for blurred vision, double vision and photophobia.  Cardiovascular: Positive for leg swelling. Negative for chest pain.  Gastrointestinal: Negative for abdominal pain.       + for fetal movement.  Genitourinary:       Neg for vaginal bleeding.   Neurological: Positive for headaches.   Physical Exam   Blood pressure 124/85, pulse 80, temperature 98.3 F (36.8 C), temperature source Oral, resp. rate 18, height 5' (1.524 m), weight 285 lb (129.275 kg), last menstrual period 10/20/2013.  Physical Exam  Constitutional: She is oriented to person, place, and time. She appears well-developed and well-nourished. No distress.  HENT:  Head: Normocephalic.  Neck: Normal range of motion.  Cardiovascular: Normal rate.   Respiratory: Effort normal.  GI: Soft. She exhibits no distension and no mass. There is no tenderness. There is no rebound and no guarding.  Genitourinary:  Cervical exam by Trinna Post, RN.  3cm, 70 %  Musculoskeletal: She exhibits edema (bilateral legs).  Neurological: She is alert and oriented to person, place, and time. No  cranial nerve deficit.  Skin: Skin is warm and dry.  Psychiatric: She has a normal mood and affect. Her behavior is normal.   Results for orders placed or performed during the hospital encounter of 07/28/14 (from the past 24 hour(s))  Protein / creatinine ratio, urine     Status: None   Collection Time: 07/28/14 11:45 AM  Result Value Ref Range   Creatinine, Urine 43.00 mg/dL   Total Protein, Urine <6 mg/dL   Protein Creatinine Ratio        0.00 - 0.15 mg/mg[Cre]  Urinalysis, Routine w reflex microscopic     Status: Abnormal   Collection Time: 07/28/14 11:45 AM  Result Value Ref Range   Color, Urine YELLOW YELLOW   APPearance HAZY (A) CLEAR   Specific Gravity, Urine 1.010 1.005 - 1.030   pH 5.0 5.0 - 8.0   Glucose, UA  NEGATIVE NEGATIVE mg/dL   Hgb urine dipstick NEGATIVE NEGATIVE   Bilirubin Urine NEGATIVE NEGATIVE   Ketones, ur NEGATIVE NEGATIVE mg/dL   Protein, ur NEGATIVE NEGATIVE mg/dL   Urobilinogen, UA 0.2 0.0 - 1.0 mg/dL   Nitrite NEGATIVE NEGATIVE   Leukocytes, UA NEGATIVE NEGATIVE  CBC     Status: Abnormal   Collection Time: 07/28/14 12:01 PM  Result Value Ref Range   WBC 9.4 4.0 - 10.5 K/uL   RBC 4.36 3.87 - 5.11 MIL/uL   Hemoglobin 12.2 12.0 - 15.0 g/dL   HCT 16.1 (L) 09.6 - 04.5 %   MCV 81.0 78.0 - 100.0 fL   MCH 28.0 26.0 - 34.0 pg   MCHC 34.6 30.0 - 36.0 g/dL   RDW 40.9 81.1 - 91.4 %   Platelets 206 150 - 400 K/uL  Comprehensive metabolic panel     Status: Abnormal   Collection Time: 07/28/14 12:01 PM  Result Value Ref Range   Sodium 135 135 - 145 mmol/L   Potassium 3.8 3.5 - 5.1 mmol/L   Chloride 104 101 - 111 mmol/L   CO2 23 22 - 32 mmol/L   Glucose, Bld 87 65 - 99 mg/dL   BUN 7 6 - 20 mg/dL   Creatinine, Ser 7.82 0.44 - 1.00 mg/dL   Calcium 9.3 8.9 - 95.6 mg/dL   Total Protein 6.9 6.5 - 8.1 g/dL   Albumin 3.1 (L) 3.5 - 5.0 g/dL   AST 18 15 - 41 U/L   ALT 13 (L) 14 - 54 U/L   Alkaline Phosphatase 120 38 - 126 U/L   Total Bilirubin 0.1 (L) 0.3 - 1.2 mg/dL   GFR calc non Af Amer >60 >60 mL/min   GFR calc Af Amer >60 >60 mL/min   Anion gap 8 5 - 15  Uric acid     Status: None   Collection Time: 07/28/14 12:01 PM  Result Value Ref Range   Uric Acid, Serum 5.4 2.3 - 6.6 mg/dL  Lactate dehydrogenase     Status: None   Collection Time: 07/28/14 12:01 PM  Result Value Ref Range   LDH 132 98 - 192 U/L   Protein/Creatinine Ratio "too low to read".    Filed Vitals:   07/28/14 1202 07/28/14 1217 07/28/14 1232 07/28/14 1337  BP: 122/62 122/70 124/85 147/82  Pulse: 78 82 80 81  Temp:      TempSrc:      Resp:      Height:      Weight:       NST baseline 150, mod variability.  Reactive.  MAU Course  Procedures  MDM Dr. Mora ApplPinn called to check on patient, Candise BowensJen, NP  reported labs, Protein/Creatinine pending.   13:36  Reported to Dr. Mora ApplPinn the labs, NST, cervical exam, headache.  Order given to ADMIT.  Assessment and Plan  A:  726w3d gestation      Elevated blood pressures     Headache  P:  Admit   Jeylin Woodmansee,EVE M 07/28/2014, 1:31 PM

## 2014-07-28 NOTE — Anesthesia Preprocedure Evaluation (Signed)
Anesthesia Evaluation  Patient identified by MRN, date of birth, ID band Patient awake    Reviewed: Allergy & Precautions, Patient's Chart, lab work & pertinent test results  Airway Mallampati: III  TM Distance: >3 FB Neck ROM: Full    Dental no notable dental hx. (+) Teeth Intact   Pulmonary asthma ,  breath sounds clear to auscultation  Pulmonary exam normal       Cardiovascular hypertension, negative cardio ROS Normal cardiovascular examRhythm:Regular Rate:Normal     Neuro/Psych  Headaches, negative psych ROS   GI/Hepatic negative GI ROS, Neg liver ROS,   Endo/Other  Morbid obesity  Renal/GU negative Renal ROS  negative genitourinary   Musculoskeletal negative musculoskeletal ROS (+)   Abdominal (+) + obese,   Peds  Hematology  (+) anemia ,   Anesthesia Other Findings   Reproductive/Obstetrics (+) Pregnancy                             Anesthesia Physical Anesthesia Plan  ASA: III  Anesthesia Plan: Epidural   Post-op Pain Management:    Induction:   Airway Management Planned: Natural Airway  Additional Equipment:   Intra-op Plan:   Post-operative Plan:   Informed Consent: I have reviewed the patients History and Physical, chart, labs and discussed the procedure including the risks, benefits and alternatives for the proposed anesthesia with the patient or authorized representative who has indicated his/her understanding and acceptance.     Plan Discussed with: Anesthesiologist  Anesthesia Plan Comments:         Anesthesia Quick Evaluation

## 2014-07-28 NOTE — Anesthesia Procedure Notes (Signed)
Epidural Patient location during procedure: OB Start time: 07/28/2014 6:23 PM  Staffing Anesthesiologist: Mal AmabileFOSTER, Fallon Haecker Performed by: anesthesiologist   Preanesthetic Checklist Completed: patient identified, site marked, surgical consent, pre-op evaluation, timeout performed, IV checked, risks and benefits discussed and monitors and equipment checked  Epidural Patient position: sitting Prep: site prepped and draped and DuraPrep Patient monitoring: continuous pulse ox and blood pressure Approach: midline Location: L3-L4 Injection technique: LOR air  Needle:  Needle type: Tuohy  Needle gauge: 17 G Needle length: 9 cm and 9 Needle insertion depth: 7 cm Catheter type: closed end flexible Catheter size: 19 Gauge Catheter at skin depth: 12 cm Test dose: negative and Other  Assessment Events: blood not aspirated, injection not painful, no injection resistance, negative IV test and no paresthesia  Additional Notes Patient identified. Risks and benefits discussed including failed block, incomplete  Pain control, post dural puncture headache, nerve damage, paralysis, blood pressure Changes, nausea, vomiting, reactions to medications-both toxic and allergic and post Partum back pain. All questions were answered. Patient expressed understanding and wished to proceed. Sterile technique was used throughout procedure. Epidural site was Dressed with sterile barrier dressing. No paresthesias, signs of intravascular injection Or signs of intrathecal spread were encountered.  Patient was more comfortable after the epidural was dosed. Please see RN's note for documentation of vital signs and FHR which are stable.\

## 2014-07-28 NOTE — Progress Notes (Signed)
Ashley Barnes is a 29 y.o. G1P0000 at 6459w3d by LMP admitted for induction of labor due to Gestational HTN.  Subjective: Patient w/o complaints  Objective: BP 109/57 mmHg  Pulse 75  Temp(Src) 99.4 F (37.4 C) (Oral)  Resp 18  Ht 5' (1.524 m)  Wt 129.275 kg (285 lb)  BMI 55.66 kg/m2  LMP 10/20/2013      FHT:  FHR: 140 bpm, variability: moderate,  accelerations:  Present,  decelerations:  Absent UC:   irregular, every 6-8 minutes SVE:   Dilation: 3.5 Effacement (%): 80 Station: -2 Exam by: Essie HartPinn, Kamira Mellette  Labs: Lab Results  Component Value Date   WBC 9.4 07/28/2014   HGB 12.2 07/28/2014   HCT 35.3* 07/28/2014   MCV 81.0 07/28/2014   PLT 206 07/28/2014    Assessment / Plan: Induction of labor due to gestational hypertension,  progressing well on pitocin  Labor: AROM performed and FSE / IUPC placed Preeclampsia:  no signs or symptoms of toxicity Fetal Wellbeing:  Category I Pain Control:  Labor support without medications I/D:  n/a Anticipated MOD:  NSVD  [name] [name] 07/28/2014, 4:52 PM

## 2014-07-28 NOTE — MAU Note (Addendum)
Sent from office, BP elevated 140/86.  First time elevated. +HA,increased swelling in lower extremities, denies epigastric pain or visual changes. Decreased fetal movement in last 24 hours

## 2014-07-29 ENCOUNTER — Encounter (HOSPITAL_COMMUNITY): Payer: Self-pay | Admitting: Obstetrics

## 2014-07-29 LAB — CBC
HEMATOCRIT: 31.9 % — AB (ref 36.0–46.0)
Hemoglobin: 11 g/dL — ABNORMAL LOW (ref 12.0–15.0)
MCH: 28 pg (ref 26.0–34.0)
MCHC: 34.5 g/dL (ref 30.0–36.0)
MCV: 81.2 fL (ref 78.0–100.0)
Platelets: 180 10*3/uL (ref 150–400)
RBC: 3.93 MIL/uL (ref 3.87–5.11)
RDW: 15.3 % (ref 11.5–15.5)
WBC: 15 10*3/uL — ABNORMAL HIGH (ref 4.0–10.5)

## 2014-07-29 LAB — RPR: RPR Ser Ql: NONREACTIVE

## 2014-07-29 LAB — HIV ANTIBODY (ROUTINE TESTING W REFLEX): HIV SCREEN 4TH GENERATION: NONREACTIVE

## 2014-07-29 MED ORDER — OXYCODONE-ACETAMINOPHEN 5-325 MG PO TABS
1.0000 | ORAL_TABLET | ORAL | Status: DC | PRN
Start: 2014-07-29 — End: 2014-07-31
  Administered 2014-07-30 (×2): 1 via ORAL
  Filled 2014-07-29 (×2): qty 1

## 2014-07-29 MED ORDER — DIBUCAINE 1 % RE OINT: 1.0000 "application " | TOPICAL_OINTMENT | RECTAL | Status: DC | PRN

## 2014-07-29 MED ORDER — SENNOSIDES-DOCUSATE SODIUM 8.6-50 MG PO TABS
2.0000 | ORAL_TABLET | ORAL | Status: DC
  Administered 2014-07-29 – 2014-07-30 (×2): 2 via ORAL
  Filled 2014-07-29 (×2): qty 2

## 2014-07-29 MED ORDER — PRENATAL MULTIVITAMIN CH
1.0000 | ORAL_TABLET | Freq: Every day | ORAL | Status: DC
Start: 2014-07-30 — End: 2014-07-31
  Administered 2014-07-30: 1 via ORAL
  Filled 2014-07-29: qty 1

## 2014-07-29 MED ORDER — SODIUM CHLORIDE 0.9 % IJ SOLN: 3.0000 mL | INTRAMUSCULAR | Status: DC | PRN

## 2014-07-29 MED ORDER — BENZOCAINE-MENTHOL 20-0.5 % EX AERO
1.0000 "application " | INHALATION_SPRAY | CUTANEOUS | Status: DC | PRN
  Administered 2014-07-29 – 2014-07-31 (×2): 1 via TOPICAL
  Filled 2014-07-29 (×2): qty 56

## 2014-07-29 MED ORDER — METHYLERGONOVINE MALEATE 0.2 MG PO TABS: 0.2000 mg | ORAL_TABLET | ORAL | Status: DC | PRN

## 2014-07-29 MED ORDER — SIMETHICONE 80 MG PO CHEW: 80.0000 mg | CHEWABLE_TABLET | ORAL | Status: DC | PRN

## 2014-07-29 MED ORDER — ONDANSETRON HCL 4 MG PO TABS: 4.0000 mg | ORAL_TABLET | ORAL | Status: DC | PRN

## 2014-07-29 MED ORDER — BUPIVACAINE HCL (PF) 0.25 % IJ SOLN
INTRAMUSCULAR | Status: DC | PRN
  Administered 2014-07-29: 8 mL
  Administered 2014-07-29 (×2): 3 mL

## 2014-07-29 MED ORDER — FERROUS SULFATE 325 (65 FE) MG PO TABS
325.0000 mg | ORAL_TABLET | Freq: Two times a day (BID) | ORAL | Status: DC
  Administered 2014-07-29 – 2014-07-31 (×4): 325 mg via ORAL
  Filled 2014-07-29 (×4): qty 1

## 2014-07-29 MED ORDER — SODIUM CHLORIDE 0.9 % IJ SOLN: 3.0000 mL | Freq: Two times a day (BID) | INTRAMUSCULAR | Status: DC

## 2014-07-29 MED ORDER — WITCH HAZEL-GLYCERIN EX PADS: 1.0000 "application " | MEDICATED_PAD | CUTANEOUS | Status: DC | PRN

## 2014-07-29 MED ORDER — DIPHENHYDRAMINE HCL 25 MG PO CAPS: 25.0000 mg | ORAL_CAPSULE | Freq: Four times a day (QID) | ORAL | Status: DC | PRN

## 2014-07-29 MED ORDER — ZOLPIDEM TARTRATE 5 MG PO TABS: 5.0000 mg | ORAL_TABLET | Freq: Every evening | ORAL | Status: DC | PRN

## 2014-07-29 MED ORDER — SODIUM CHLORIDE 0.9 % IV SOLN: 250.0000 mL | INTRAVENOUS | Status: DC | PRN

## 2014-07-29 MED ORDER — IBUPROFEN 800 MG PO TABS
800.0000 mg | ORAL_TABLET | Freq: Three times a day (TID) | ORAL | Status: DC
  Administered 2014-07-29 – 2014-07-31 (×6): 800 mg via ORAL
  Filled 2014-07-29 (×6): qty 1

## 2014-07-29 MED ORDER — ONDANSETRON HCL 4 MG/2ML IJ SOLN: 4.0000 mg | INTRAMUSCULAR | Status: DC | PRN

## 2014-07-29 MED ORDER — LIDOCAINE-EPINEPHRINE (PF) 2 %-1:200000 IJ SOLN
INTRAMUSCULAR | Status: DC | PRN
  Administered 2014-07-29: 3 mL
  Administered 2014-07-29: 4 mL

## 2014-07-29 MED ORDER — FENTANYL CITRATE (PF) 100 MCG/2ML IJ SOLN
INTRAMUSCULAR | Status: AC
  Administered 2014-07-29: 100 ug via EPIDURAL
  Filled 2014-07-29: qty 2

## 2014-07-29 MED ORDER — TETANUS-DIPHTH-ACELL PERTUSSIS 5-2.5-18.5 LF-MCG/0.5 IM SUSP: 0.5000 mL | Freq: Once | INTRAMUSCULAR | Status: DC

## 2014-07-29 MED ORDER — METHYLERGONOVINE MALEATE 0.2 MG/ML IJ SOLN: 0.2000 mg | INTRAMUSCULAR | Status: DC | PRN

## 2014-07-29 MED ORDER — SODIUM BICARBONATE 8.4 % IV SOLN
INTRAVENOUS | Status: DC | PRN
  Administered 2014-07-29: 10 mL via EPIDURAL

## 2014-07-29 MED ORDER — ACETAMINOPHEN 325 MG PO TABS: 650.0000 mg | ORAL_TABLET | ORAL | Status: DC | PRN

## 2014-07-29 MED ORDER — LANOLIN HYDROUS EX OINT: TOPICAL_OINTMENT | CUTANEOUS | Status: DC | PRN

## 2014-07-29 MED ORDER — MAGNESIUM HYDROXIDE 400 MG/5ML PO SUSP: 30.0000 mL | ORAL | Status: DC | PRN

## 2014-07-29 MED ORDER — OXYCODONE-ACETAMINOPHEN 5-325 MG PO TABS: 2.0000 | ORAL_TABLET | ORAL | Status: DC | PRN

## 2014-07-29 MED ORDER — MEASLES, MUMPS & RUBELLA VAC ~~LOC~~ INJ: 0.5000 mL | INJECTION | Freq: Once | SUBCUTANEOUS | Status: DC

## 2014-07-29 NOTE — Lactation Note (Signed)
This note was copied [facility] the [facility]. Lactation Consultation Note Mom has company in room [facility]. Mom is going to pump and bottle feed. Has personal pump and has all ready pumped. Gave vials and syring and soap.  Patient Name: Girl Evert KohlGissel Barnes ZOXWR'UToday's Date: 07/29/2014     Maternal Data    Feeding Feeding Type: Formula Nipple Type: Regular  LATCH Score/Interventions                      Lactation Tools Discussed/Used     Consult Status      Charyl DancerCARVER, Colson Barco G 07/29/2014, 6:48 PM

## 2014-07-29 NOTE — Lactation Note (Signed)
This note was copied from the chart [facility]. Lactation Consultation Note  Patient Name: Ashley Evert KohlGissel Barnes ONGEX'BToday's Date: 07/29/2014 Reason for consult: Initial assessment Mom wants to pump/bottle only. She has her own Medela PNS. She is pumping receiving 1-2 ml of colostrum. Encouraged to pump every 3 hours for 15 minutes, hand express after pumping. Advised to give baby back any amount of EBM she receives. Mom is supplementing with formula. Cleaning of pump pieces/storage guideline reviewed. Mom has lactation brochure for review. Encouraged to call for assist if needed.   Maternal Data Has patient been taught Hand Expression?: Yes Does the patient have breastfeeding experience prior to this delivery?: No  Feeding    LATCH Score/Interventions                      Lactation Tools Discussed/Used Tools: Pump Breast pump type: Double-Electric Breast Pump (Mom has her own DEBP - Medela PNS)   Consult Status Consult Status: Follow-up Date: 07/30/14 Follow-up type: In-patient    Alfred LevinsGranger, [name] 07/29/2014, 11:13 PM

## 2014-07-29 NOTE — Progress Notes (Signed)
Pt progressing- now 8-9 per nurse.  FHTs 140s with gSTV and accels- just makes reactive.  Occ mild variable. Toco q 3-4  Continue induction.

## 2014-07-30 LAB — CBC
HCT: 30.4 % — ABNORMAL LOW (ref 36.0–46.0)
HEMOGLOBIN: 10.2 g/dL — AB (ref 12.0–15.0)
MCH: 27.3 pg (ref 26.0–34.0)
MCHC: 33.6 g/dL (ref 30.0–36.0)
MCV: 81.3 fL (ref 78.0–100.0)
Platelets: 208 10*3/uL (ref 150–400)
RBC: 3.74 MIL/uL — ABNORMAL LOW (ref 3.87–5.11)
RDW: 15.4 % (ref 11.5–15.5)
WBC: 15.9 10*3/uL — AB (ref 4.0–10.5)

## 2014-07-30 NOTE — Progress Notes (Signed)
Patient is doing well.  She is ambulating, voiding, tolerating PO.  Pain control is good.  Lochia is appropriate  Filed Vitals:   07/29/14 1415 07/29/14 1515 07/29/14 1835 07/30/14 0600  BP: 110/60 121/66 118/59 134/53  Pulse: 95 89 88 86  Temp: 98.9 F (37.2 C) 98.9 F (37.2 C) 98.4 F (36.9 C) 98.1 F (36.7 C)  TempSrc: Oral Oral Oral Oral  Resp: 18 18 18 18   Height:      Weight:      SpO2:    100%    NAD Fundus firm Ext: 1+ edema b/l  Lab Results  Component Value Date   WBC 15.9* 07/30/2014   HGB 10.2* 07/30/2014   HCT 30.4* 07/30/2014   MCV 81.3 07/30/2014   PLT 208 07/30/2014    --/--/O POS (05/23 1436)/RImmune  A/P 28 y.o. G1P1001 PPD#1 s/p TSVD GHTN: BPs mild range since delivery.  Labs wnl on admission. Routine care.   Expect d/c tomorrow.    [facility]NA GEFFEL [employer]CL[employer]

## 2014-07-31 MED ORDER — OXYCODONE-ACETAMINOPHEN 5-325 MG PO TABS: 1.0000 | ORAL_TABLET | ORAL | Status: DC | PRN

## 2014-07-31 MED ORDER — IBUPROFEN 800 MG PO TABS: 800.0000 mg | ORAL_TABLET | Freq: Three times a day (TID) | ORAL | Status: DC | PRN

## 2014-07-31 NOTE — Progress Notes (Signed)
Post Partum Day 2 Subjective: no complaints, up ad lib, voiding, tolerating PO, + flatus and breast feeding  Objective: Blood pressure 118/66, pulse 85, temperature 98.5 F (36.9 C), temperature source Oral, resp. rate 18, height 5' (1.524 m), weight 129.275 kg (285 lb), last menstrual period 10/20/2013, SpO2 100 %, unknown if currently breastfeeding.  Physical Exam:  General: alert, cooperative and appears stated age Lochia: appropriate Uterine Fundus: firm perineum: healing well, no significant drainage, no dehiscence, no significant erythema DVT Evaluation: No evidence of DVT seen on physical exam. Negative Homan's sign. No cords or calf tenderness.   Recent Labs  07/29/14 1257 07/30/14 0515  HGB 11.0* 10.2*  HCT 31.9* 30.4*    Assessment/Plan: Discharge home, Breastfeeding and Contraception will discuss at post partum visit   LOS: 3 days   Kirti Carl [name] 07/31/2014, 7:45 AM

## 2014-07-31 NOTE — Progress Notes (Signed)
Patient complaining of intermittent " shooting pains from right hip to right ankle " which are more prominent when ambulating. She states these have been present since delivery. She pushed for approximately 45 min with her legs in the hyperflexed position. She denies any motor or sensory disturbances, nor bowel or bladder dysfunction. She states the percocet she has been taking dulls the discomfort. She denies any back pain. I feel her pain is more related to her hyperflexion at the hips while she was pushing, stretching the sciatic nerve. There was no reported problems with insertion of her epidural catheter. If her pain is persistent and not better in 7-10 days would recommend Neuro consult and imaging of her back.

## 2014-07-31 NOTE — Discharge Summary (Signed)
Obstetric Discharge Summary Reason for Admission: onset of labor Prenatal Procedures: NST and ultrasound Intrapartum Procedures: spontaneous vaginal delivery Postpartum Procedures: none Complications-Operative and Postpartum: none HEMOGLOBIN  Date Value Ref Range Status  07/30/2014 10.2* 12.0 - 15.0 g/dL Final   HCT  Date Value Ref Range Status  07/30/2014 30.4* 36.0 - 46.0 % Final    Physical Exam:  General: alert, cooperative and appears stated age 81Lochia: appropriate Uterine Fundus: firm perineum: healing well, no significant drainage, no dehiscence, no significant erythema DVT Evaluation: No evidence of DVT seen on physical exam. Negative Homan's sign. No cords or calf tenderness.  Discharge Diagnoses: Term Pregnancy-delivered  Discharge Information: Date: 07/31/2014 Activity: pelvic rest Diet: routine Medications: PNV, Ibuprofen and Percocet Condition: stable Instructions: refer to practice specific booklet Discharge to: home   Newborn Data: Live born female  Birth Weight: 7 lb 11.6 oz (3505 g) APGAR: 9, 9  Home with mother.  Essie HartINN, Simrin Vegh [name] 07/31/2014, 7:48 AM

## 2014-07-31 NOTE — Addendum Note (Signed)
Addendum  created 07/31/14 0916 by Mal AmabileMichael Rashana Andrew, MD   Modules edited: Clinical Notes   Clinical Notes:  File: [id number]; Pend: [id number]; Pend: [id number]

## 2014-07-31 NOTE — Lactation Note (Signed)
This note was copied from the chart of Ashley Barnes. Lactation Consultation Note  Follow up visit made prior to discharge.  Mom was going to pump and bottle feed but she states baby does not like bottle and latches easily to breast.  Reviewed basics and discharge teaching including engorgement treatment.  Outpatient lactation services and support encouraged.  Patient Name: Ashley Barnes GNFAO'ZToday's Date: 07/31/2014     Maternal Data    Feeding    LATCH Score/Interventions                      Lactation Tools Discussed/Used     Consult Status      Huston FoleyMOULDEN, Uri Turnbough S 07/31/2014, 10:01 AM

## 2014-08-01 ENCOUNTER — Telehealth: Payer: Self-pay | Admitting: Physician Assistant

## 2014-08-01 ENCOUNTER — Encounter (HOSPITAL_COMMUNITY): Payer: Self-pay | Admitting: *Deleted

## 2014-08-01 ENCOUNTER — Inpatient Hospital Stay (HOSPITAL_COMMUNITY)
Admission: AD | Admit: 2014-08-01 | Discharge: 2014-08-01 | Disposition: A | Discharge status: Home / Self Care | Payer: BLUE CROSS/BLUE SHIELD | Source: Ambulatory Visit | Attending: Obstetrics & Gynecology | Admitting: Obstetrics & Gynecology

## 2014-08-01 ENCOUNTER — Ambulatory Visit (HOSPITAL_COMMUNITY)
Admission: RE | Admit: 2014-08-01 | Discharge: 2014-08-01 | Disposition: A | Discharge status: Home / Self Care | Payer: BLUE CROSS/BLUE SHIELD | Source: Ambulatory Visit | Attending: Physician Assistant | Admitting: Physician Assistant

## 2014-08-01 DIAGNOSIS — M25551 Pain in right hip: Secondary | ICD-10-CM | POA: Insufficient documentation

## 2014-08-01 DIAGNOSIS — M79604 Pain in right leg: Secondary | ICD-10-CM | POA: Diagnosis not present

## 2014-08-01 DIAGNOSIS — Z8249 Family history of ischemic heart disease and other diseases of the circulatory system: Secondary | ICD-10-CM | POA: Diagnosis not present

## 2014-08-01 DIAGNOSIS — Z833 Family history of diabetes mellitus: Secondary | ICD-10-CM | POA: Diagnosis not present

## 2014-08-01 DIAGNOSIS — Z880 Allergy status to penicillin: Secondary | ICD-10-CM | POA: Diagnosis not present

## 2014-08-01 DIAGNOSIS — O9089 Other complications of the puerperium, not elsewhere classified: Secondary | ICD-10-CM | POA: Insufficient documentation

## 2014-08-01 DIAGNOSIS — M79609 Pain in unspecified limb: Secondary | ICD-10-CM | POA: Diagnosis not present

## 2014-08-01 DIAGNOSIS — R200 Anesthesia of skin: Secondary | ICD-10-CM

## 2014-08-01 DIAGNOSIS — M79606 Pain in leg, unspecified: Secondary | ICD-10-CM | POA: Diagnosis present

## 2014-08-01 LAB — URINALYSIS, ROUTINE W REFLEX MICROSCOPIC
Bilirubin Urine: NEGATIVE
GLUCOSE, UA: NEGATIVE mg/dL
Ketones, ur: NEGATIVE mg/dL
Nitrite: NEGATIVE
Protein, ur: NEGATIVE mg/dL
Specific Gravity, Urine: <1.005 — ABNORMAL LOW (ref 1.005–1.030)
UROBILINOGEN UA: 0.2 mg/dL (ref 0.0–1.0)
pH: 5.5 (ref 5.0–8.0)

## 2014-08-01 LAB — URINE MICROSCOPIC-ADD ON

## 2014-08-01 NOTE — Telephone Encounter (Signed)
[facility] and no order is able to be found.  Order only placed.

## 2014-08-01 NOTE — MAU Note (Signed)
PT  SAYS SHE DEL VAG ON 5-24.   SHE WAS SWOLLEN BEFORE  DEL-  AND HAS CONTINUE  WITH SWELLING.  HER  ENTIRE  RIGHT  LEG AND RIGHT  ARM   STARTED  HURTING   AT 0100.  SHE CALLED  DR AND TOLD  TO COME IN      BREAST FEEDING .  VAG  BLEEDING   WNL

## 2014-08-01 NOTE — MAU Provider Note (Signed)
History     CSN: [id number]  Arrival date and time: 08/01/14 6962   First Provider Initiated Contact with Patient 08/01/14 0247      No chief complaint on file.  HPI  Ashley Barnes is a 29 y.o. G1P1001 who is S/P NSVD on 07/29/14. She states that she woke out of her sleep with leg and arm pain. She states that the leg pain is in the same location as the pain she had prior to DC that she was discussing with the anesthesiologist However, the pain now is more dull and achy. She states that the pain goes from her right hip, down her thigh, into her calf, and foot. She also states that the right leg is more swollen than the left. She rates her pain 8/10 at this time. She has not taken anything for the pain. The arm pain, she states, goes from the shoulder down to the hand.   Past Medical History  Diagnosis Date  . Obesity   . Allergy   . Asthma   . Recurrent sinusitis   . Anemia   . Abnormal Pap smear     12-29-09 ASCUS+HPV, 01-04-12 LSIL, 01-10-13 ASCUS +HPV  . Migraines     no aura  . Blood type O+     on donor card from [facility]    Past Surgical History  Procedure Laterality Date  . Wisdom tooth extraction    . 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 Diabetes Mother 28  . Hypertension Mother   . Heart disease Father 33    AMI x 3  . Hypertension Father   . Cancer Paternal Grandfather     colon    History  Substance Use Topics  . Smoking status: Never Smoker   . Smokeless tobacco: Never Used  . Alcohol Use: No    Allergies:  Allergies  Allergen Reactions  . Corticosteroids Anaphylaxis and Hives  . Penicillins Anaphylaxis  . Zocor [Simvastatin - High Dose] Hives and Rash    Prescriptions prior to admission  Medication Sig Dispense Refill Last Dose  . ibuprofen (ADVIL,MOTRIN) 800 MG tablet Take 1 tablet (800 mg total) by mouth every 8 (eight) hours as needed. 60 tablet 3 07/31/2014 at Unknown  time  . oxyCODONE-acetaminophen (PERCOCET/ROXICET) 5-325 MG per tablet Take 1-2 tablets by mouth every 4 (four) hours as needed (for pain scale 4-7). 30 tablet 0 07/31/2014 at Unknown time  . Prenatal Vit-Fe Fumarate-FA (PRENATAL MULTIVITAMIN) TABS tablet Take 1 tablet by mouth daily at 12 noon.   07/31/2014 at Unknown time    Review of Systems  Constitutional: Negative for fever.  Respiratory: Negative for shortness of breath.   Cardiovascular: Positive for leg swelling. Negative for chest pain.   Physical Exam   Blood pressure 124/65, pulse 91, temperature 98.5 F (36.9 C), temperature source Oral, resp. rate 20, height 5' (1.524 m), weight 126.724 kg (279 lb 6 oz), last menstrual period 10/20/2013, unknown if currently breastfeeding.  Physical Exam  Nursing note and vitals reviewed. Constitutional: She is oriented to person, place, and time. She appears well-developed and well-nourished. No distress.  Cardiovascular: Normal rate.   Respiratory: Effort normal.  GI: Soft. There is no tenderness. There is no rebound.  Musculoskeletal: She exhibits edema. She exhibits no tenderness.  Negative homan's sign Right calf 50 cm Left calf 48.5 cm   Neurological: She  is alert and oriented to person, place, and time.  Skin: Skin is warm and dry.  Psychiatric: She has a normal mood and affect.    MAU Course  Procedures  MDM 0303: D/W Dr. Mora ApplPinn, ok for dc home return to vascular lab in AM for venous study.   Assessment and Plan   1. Pain of right lower extremity   2. Postpartum care and examination    DC home Likely muscle strain Will get vascular study to R/O DVT [facility]ous [facility] 0[facility] Return to MAU as needed  Follow-up Information    [facility].   Specialty:  Vascular Surgery   Why:  please go there at 0800 on 08/01/14    Contact information:   [address] [id number] [place]  [place] [id number] [phone]       Tawnya CrookHogan, Heather Donovan 08/01/2014, 2:53 AM

## 2014-08-01 NOTE — Progress Notes (Signed)
Right Lower Ext. Venous Duplex Completed. Negative for DVT or SVT in the right lower extremity. Marilynne Halstedita Kamren Heintzelman, BS, RDMS, RVT

## 2014-08-01 NOTE — MAU Note (Signed)
CALLED AND LEFT MESSAGE AT [phone]-  GAVE PT 'S NAME AND DX-  WITH ORDERS   AND INSTRUCTIONS  TO BE THERE AT 8 AM.

## 2015-01-21 ENCOUNTER — Ambulatory Visit (HOSPITAL_COMMUNITY): Payer: BLUE CROSS/BLUE SHIELD

## 2015-01-23 ENCOUNTER — Ambulatory Visit (HOSPITAL_COMMUNITY)
Admission: RE | Admit: 2015-01-23 | Discharge: 2015-01-23 | Disposition: A | Discharge status: Home / Self Care | Payer: BLUE CROSS/BLUE SHIELD | Source: Ambulatory Visit | Attending: Obstetrics | Admitting: Obstetrics

## 2015-01-23 NOTE — Lactation Note (Signed)
Lactation Consult  Mother's reason for visit:  Concerned about milk supply. Baby now 905 1/2 months old mom only pumping 1-2 oz per pumping   Consult:  Initial Lactation Consultant:  Ashley Barnes, Ashley Barnes  ________________________________________________________________________ Mom has tried Fenugreek, Mother Love teas, capsules and liquid. She has tried power pumping. Mom pumps 4 times at work for 15 min each time. Suggested pumping for 20-30 min and mom reports pumping begins to hurt. #27 flange given to mom to try but she reports no difference in how it feels. Has Medela PIS advanced pump.Reports she only nurses during the weekend and baby seems satisfied after nursing. Obtains more milk on Mondays than Fridays. Reports drinking plenty of fluids. Baby taking solid foods- cereal and vegetables. Reports no changes in her breasts during pregnancy and baby has been slow to gain weight. Is supplementing with formula because she does not have enough milk. Praise given for her efforts for continuing to try to increase milk supply.No further questions at present. To call prn   ________________________________________________________________________  Mother's Name: Ashley Barnes Type of delivery:   Breastfeeding Experience:  P1 ________________________________________________________________________  Pre-feed weight:  6772 g  (14 lb. 14.9 oz.) Post-feed weight:  6808 g (15 lb. 0.2 oz.) Amount transferred:  36 ml Amount supplemented:  Mom will nurse again when she gets home. Baby distracted and looking all around instead of nursing.

## 2015-03-25 ENCOUNTER — Ambulatory Visit (INDEPENDENT_AMBULATORY_CARE_PROVIDER_SITE_OTHER): Payer: BLUE CROSS/BLUE SHIELD | Admitting: Certified Nurse Midwife

## 2015-03-25 ENCOUNTER — Encounter: Payer: Self-pay | Admitting: Certified Nurse Midwife

## 2015-03-25 VITALS — BP 114/70 | HR 68 | Resp 16 | Ht 61.0 in | Wt 240.0 lb

## 2015-03-25 DIAGNOSIS — Z0000 Encounter for general adult medical examination without abnormal findings: Secondary | ICD-10-CM | POA: Diagnosis not present

## 2015-03-25 DIAGNOSIS — Z124 Encounter for screening for malignant neoplasm of cervix: Secondary | ICD-10-CM | POA: Diagnosis not present

## 2015-03-25 DIAGNOSIS — Z01419 Encounter for gynecological examination (general) (routine) without abnormal findings: Secondary | ICD-10-CM

## 2015-03-25 LAB — CBC
HEMATOCRIT: 40.7 % (ref 36.0–46.0)
HEMOGLOBIN: 13.6 g/dL (ref 12.0–15.0)
MCH: 26.5 pg (ref 26.0–34.0)
MCHC: 33.4 g/dL (ref 30.0–36.0)
MCV: 79.3 fL (ref 78.0–100.0)
MPV: 10.2 fL (ref 8.6–12.4)
Platelets: 327 10*3/uL (ref 150–400)
RBC: 5.13 MIL/uL — AB (ref 3.87–5.11)
RDW: 15.8 % — ABNORMAL HIGH (ref 11.5–15.5)
WBC: 7.2 10*3/uL (ref 4.0–10.5)

## 2015-03-25 LAB — LIPID PANEL
CHOL/HDL RATIO: 5.6 ratio — AB (ref <=5.0)
CHOLESTEROL: 230 mg/dL — AB (ref 125–200)
HDL: 41 mg/dL — AB (ref >=46)
LDL Cholesterol: 168 mg/dL — ABNORMAL HIGH (ref <130)
TRIGLYCERIDES: 104 mg/dL (ref <150)
VLDL: 21 mg/dL (ref <30)

## 2015-03-25 LAB — POCT URINALYSIS DIPSTICK
Bilirubin, UA: NEGATIVE
Blood, UA: NEGATIVE
Glucose, UA: NEGATIVE
KETONES UA: NEGATIVE
Leukocytes, UA: NEGATIVE
Nitrite, UA: NEGATIVE
PROTEIN UA: NEGATIVE
Urobilinogen, UA: NEGATIVE
pH, UA: 5

## 2015-03-25 LAB — COMPREHENSIVE METABOLIC PANEL
ALBUMIN: 4.4 g/dL (ref 3.6–5.1)
ALT: 14 U/L (ref 6–29)
AST: 15 U/L (ref 10–30)
Alkaline Phosphatase: 92 U/L (ref 33–115)
BUN: 19 mg/dL (ref 7–25)
CHLORIDE: 107 mmol/L (ref 98–110)
CO2: 22 mmol/L (ref 20–31)
Calcium: 9.7 mg/dL (ref 8.6–10.2)
Creat: 0.79 mg/dL (ref 0.50–1.10)
Glucose, Bld: 85 mg/dL (ref 65–99)
Potassium: 4.1 mmol/L (ref 3.5–5.3)
Sodium: 138 mmol/L (ref 135–146)
Total Bilirubin: 0.5 mg/dL (ref 0.2–1.2)
Total Protein: 7.6 g/dL (ref 6.1–8.1)

## 2015-03-25 LAB — TSH: TSH: 2.037 u[IU]/mL (ref 0.350–4.500)

## 2015-03-25 NOTE — Progress Notes (Signed)
30 y.o. G1P1001 Single  Hispanic Fe here for annual exam. Periods only once with spotting after delivery of baby on 07/29/14 7 lbs. 11 oz. SVD. Breastfeeding. All went well with pregnancy and delivery. Very happy. Contraception Nexplanon inserted 11/20/14. Continues with weight loss after pregnancy down 30 pounds. Fasted for screening labs. Emotionally doing well with no support from FOB. Family very supportive. Sees Urgent care if needed. No other health issues today.  No LMP recorded. recent pregnancy         Sexually active: Yes.    The current method of family planning is nexplanon.    Exercising: Yes.    cardio & strength training Smoker:  no  Health Maintenance: Pap:  01-10-13 ASCUS HPV HR +, colpo 02-05-13 CIN1, 12/15 neg per patient MMG:  none Colonoscopy:  none BMD:   none TDaP:  2016 Shingles: no Pneumonia: no Hep C and HIV: HIV 2014 neg, Hep C maybe done 2015 Labs: poct urine-neg Self breast exam: pt checks them   reports that she has never smoked. She has never used smokeless tobacco. She reports that she does not drink alcohol or use illicit drugs.  Past Medical History  Diagnosis Date  . Obesity   . Allergy   . Asthma   . Recurrent sinusitis   . Anemia   . Abnormal Pap smear     12-29-09 ASCUS+HPV, 01-04-12 LSIL, 01-10-13 ASCUS +HPV  . Migraines     no aura  . Blood type O+     on donor card from [facility]    Past Surgical History  Procedure Laterality Date  . Wisdom tooth extraction    . Colposcopy      2011 & 2013 CIN1, 02-06-13     Current Outpatient Prescriptions  Medication Sig Dispense Refill  . ibuprofen (ADVIL,MOTRIN) 800 MG tablet Take 1 tablet (800 mg total) by mouth every 8 (eight) hours as needed. 60 tablet 3  . Prenatal Vit-Fe Fumarate-FA (PRENATAL MULTIVITAMIN) TABS tablet Take 1 tablet by mouth daily at 12 noon.     No current facility-administered medications for this visit.    Family History  Problem Relation Age of Onset  .  Diabetes Mother 41  . Hypertension Mother   . Heart disease Father 44    AMI x 3  . Hypertension Father   . Cancer Paternal Grandfather     colon    ROS:  Pertinent items are noted in HPI.  Otherwise, a comprehensive ROS was negative.  Exam:   BP 114/70 mmHg  Pulse 68  Resp 16  Ht  (1.549 m)  Wt 240 lb (108.863 kg)  BMI 45.37 kg/m2  Breastfeeding? Yes Height:  (154.9 cm) Ht Readings from Last 3 Encounters:  03/25/15  (1.549 m)  08/01/14 5' (1.524 m)  07/28/14 5' (1.524 m)    General appearance: alert, cooperative and appears stated age Head: Normocephalic, without obvious abnormality, atraumatic Neck: no adenopathy, supple, symmetrical, trachea midline and thyroid normal to inspection and palpation Lungs: clear to auscultation bilaterally Breasts: normal appearance, no masses or tenderness, No nipple retraction or dimpling, No nipple discharge or bleeding, No axillary or supraclavicular adenopathy, lactating Heart: regular rate and rhythm Abdomen: soft, non-tender; no masses,  no organomegaly Extremities: extremities normal, atraumatic, no cyanosis or edema Skin: Skin color, texture, turgor normal. No rashes or lesions Lymph nodes: Cervical, supraclavicular, and axillary nodes normal. No abnormal inguinal nodes palpated Neurologic: Grossly normal   Pelvic: External genitalia:  no lesions              Urethra:  normal appearing urethra with no masses, tenderness or lesions              Bartholin's and Skene's: normal                 Vagina: normal appearing vagina with normal color and discharge, no lesions              Cervix: normal,non tender,no lesions              Pap taken: Yes.   Bimanual Exam:  Uterus:  normal size, contour, position, consistency, mobility, non-tender              Adnexa: normal adnexa and no mass, fullness, tenderness               Rectovaginal: Confirms               Anus:  normal sphincter tone, no lesions  Chaperone present:  yes  A:  Well Woman with normal exam  Contraception Nexplanon  Removal due 11/19/17  Screening labs  P:   Reviewed health and wellness pertinent to exam  Aware of warning signs of Nexplanon and need to advise  Lab: CBC, CMP, Lipid panel, TSH, Vitamin D  Pap smear as above not taken   counseled on breast self exam, STD prevention, HIV risk factors and prevention, adequate intake of calcium and vitamin D, diet and exercise  return annually or prn  An After Visit Summary was printed and given to the patient.

## 2015-03-25 NOTE — Patient Instructions (Signed)

## 2015-03-26 ENCOUNTER — Telehealth: Payer: Self-pay

## 2015-03-26 ENCOUNTER — Other Ambulatory Visit: Payer: Self-pay | Admitting: Certified Nurse Midwife

## 2015-03-26 ENCOUNTER — Ambulatory Visit: Payer: BLUE CROSS/BLUE SHIELD | Admitting: Certified Nurse Midwife

## 2015-03-26 DIAGNOSIS — R899 Unspecified abnormal finding in specimens from other organs, systems and tissues: Secondary | ICD-10-CM

## 2015-03-26 LAB — VITAMIN D 25 HYDROXY (VIT D DEFICIENCY, FRACTURES): Vit D, 25-Hydroxy: 21 ng/mL — ABNORMAL LOW (ref 30–100)

## 2015-03-26 NOTE — Telephone Encounter (Signed)
reina cma, spoke with patient.

## 2015-03-26 NOTE — Telephone Encounter (Signed)
-----   Message from Verner Chol, CNM sent at 03/26/2015  7:37 AM EST ----- Notify patient vitamin D is low needs protocol TSH normal Liver, kidney, glucose profile normal Lipid panel shows elevated cholesterol at 230, 2 years ago 191 HDL cholesterol 41 previous 47 this is your protective cholesterol LDL168 previous 120 this the harmful cholesterol Continue to work on weight loss, start on Omega 3 fish oil one daily, decrease fried foods and cholesterol foods, work on daily exercise and more fresh vegetables and whole grains recheck in 6 months order in CBC is essentially normal

## 2015-03-26 NOTE — Telephone Encounter (Signed)
[email or web address]

## 2015-03-27 LAB — IPS PAP TEST WITH HPV

## 2015-03-30 NOTE — Progress Notes (Signed)
Reviewed personally.  M. Suzanne Shamikia Linskey, MD.  

## 2015-04-08 ENCOUNTER — Ambulatory Visit (INDEPENDENT_AMBULATORY_CARE_PROVIDER_SITE_OTHER): Payer: BLUE CROSS/BLUE SHIELD | Admitting: Family Medicine

## 2015-04-08 ENCOUNTER — Encounter: Payer: Self-pay | Admitting: Family Medicine

## 2015-04-08 VITALS — BP 108/80 | HR 78 | Temp 99.6°F | Resp 17 | Ht 61.0 in | Wt 239.0 lb

## 2015-04-08 DIAGNOSIS — J029 Acute pharyngitis, unspecified: Secondary | ICD-10-CM

## 2015-04-08 DIAGNOSIS — J069 Acute upper respiratory infection, unspecified: Secondary | ICD-10-CM

## 2015-04-08 NOTE — Patient Instructions (Signed)
Saline nasal spray atleast 4 times per day, over the counter mucinex or mucinex DM for cough, cepacol or other cough drop, sudafed if needed, and drink plenty of fluids. Make sure your childs pediatrician is ok with these over the counter medicines for you while breastfeeding.   Return to the clinic or go to the nearest emergency room if any of your symptoms worsen or new symptoms occur.  Upper Respiratory Infection, Adult Most upper respiratory infections (URIs) are a viral infection of the air passages leading to the lungs. A URI affects the nose, throat, and upper air passages. The most common type of URI is nasopharyngitis and is typically referred to as "the common cold." URIs run their course and usually go away on their own. Most of the time, a URI does not require medical attention, but sometimes a bacterial infection in the upper airways can follow a viral infection. This is called a secondary infection. Sinus and middle ear infections are common types of secondary upper respiratory infections. Bacterial pneumonia can also complicate a URI. A URI can worsen asthma and chronic obstructive pulmonary disease (COPD). Sometimes, these complications can require emergency medical care and may be life threatening.  CAUSES Almost all URIs are caused by viruses. A virus is a type of germ and can spread from one person to another.  RISKS FACTORS You may be at risk for a URI if:   You smoke.   You have chronic heart or lung disease.  You have a weakened defense (immune) system.   You are very young or very old.   You have nasal allergies or asthma.  You work in crowded or poorly ventilated areas.  You work in health care facilities or schools. SIGNS AND SYMPTOMS  Symptoms typically develop 2-3 days after you come in contact with a cold virus. Most viral URIs last 7-10 days. However, viral URIs from the influenza virus (flu virus) can last 14-18 days and are typically more severe. Symptoms  may include:   Runny or stuffy (congested) nose.   Sneezing.   Cough.   Sore throat.   Headache.   Fatigue.   Fever.   Loss of appetite.   Pain in your forehead, behind your eyes, and over your cheekbones (sinus pain).  Muscle aches.  DIAGNOSIS  Your health care provider may diagnose a URI by:  Physical exam.  Tests to check that your symptoms are not due to another condition such as:  Strep throat.  Sinusitis.  Pneumonia.  Asthma. TREATMENT  A URI goes away on its own with time. It cannot be cured with medicines, but medicines may be prescribed or recommended to relieve symptoms. Medicines may help:  Reduce your fever.  Reduce your cough.  Relieve nasal congestion. HOME CARE INSTRUCTIONS   Take medicines only as directed by your health care provider.   Gargle warm saltwater or take cough drops to comfort your throat as directed by your health care provider.  Use a warm mist humidifier or inhale steam from a shower to increase air moisture. This may make it easier to breathe.  Drink enough fluid to keep your urine clear or pale yellow.   Eat soups and other clear broths and maintain good nutrition.   Rest as needed.   Return to work when your temperature has returned to normal or as your health care provider advises. You may need to stay home longer to avoid infecting others. You can also use a face mask and careful hand  washing to prevent spread of the virus.  Increase the usage of your inhaler if you have asthma.   Do not use any tobacco products, including cigarettes, chewing tobacco, or electronic cigarettes. If you need help quitting, ask your health care provider. PREVENTION  The best way to protect yourself from getting a cold is to practice good hygiene.   Avoid oral or hand contact with people with cold symptoms.   Wash your hands often if contact occurs.  There is no clear evidence that vitamin C, vitamin E, echinacea, or  exercise reduces the chance of developing a cold. However, it is always recommended to get plenty of rest, exercise, and practice good nutrition.  SEEK MEDICAL CARE IF:   You are getting worse rather than better.   Your symptoms are not controlled by medicine.   You have chills.  You have worsening shortness of breath.  You have brown or red mucus.  You have yellow or brown nasal discharge.  You have pain in your face, especially when you bend forward.  You have a fever.  You have swollen neck glands.  You have pain while swallowing.  You have white areas in the back of your throat. SEEK IMMEDIATE MEDICAL CARE IF:   You have severe or persistent:  Headache.  Ear pain.  Sinus pain.  Chest pain.  You have chronic lung disease and any of the following:  Wheezing.  Prolonged cough.  Coughing up blood.  A change in your usual mucus.  You have a stiff neck.  You have changes in your:  Vision.  Hearing.  Thinking.  Mood. MAKE SURE YOU:   Understand these instructions.  Will watch your condition.  Will get help right away if you are not doing well or get worse.   This information is not intended to replace advice given to you by your health care provider. Make sure you discuss any questions you have with your health care provider.   Document Released: 08/17/2000 Document Revised: 07/08/2014 Document Reviewed: 05/29/2013 Elsevier Interactive Patient Education [employer]

## 2015-04-08 NOTE — Progress Notes (Signed)
Subjective:    Patient ID: Ashley Barnes, female    DOB: May 18, 1985, 30 y.o.   MRN: [id number]  HPI Ashley Barnes is a 30 y.o. female  Started with sore throat, burning with PND, sneezing, runny nose. Started 2 days ago. 81 month old girl at home - healthy and UTD on immunizations.   Did not get a flu vaccine this year.   No known fever, works at [employer] - some coworkers sick with cough and cold. No known strep throat contacts.  Tx: benadryl,  Still breastfeeding.     Patient Active Problem List   Diagnosis Date Noted  . Postpartum state 07/29/2014  . Gestational hypertension 07/28/2014  . Asthma 12/23/2011  . AR (allergic rhinitis) 12/23/2011   Past Medical History  Diagnosis Date  . Obesity   . Allergy   . Asthma   . Recurrent sinusitis   . Anemia   . Abnormal Pap smear     12-29-09 ASCUS+HPV, 01-04-12 LSIL, 01-10-13 ASCUS +HPV  . Migraines     no aura  . Blood type O+     on donor card [facility]   Past Surgical History  Procedure Laterality Date  . Wisdom tooth extraction    . Colposcopy      2011 & 2013 CIN1, 02-06-13    Allergies  Allergen Reactions  . Corticosteroids Anaphylaxis and Hives  . Penicillins Anaphylaxis  . Zocor [Simvastatin - High Dose] Hives and Rash   Prior to Admission medications   Medication Sig Start Date End Date Taking? Authorizing Provider  ibuprofen (ADVIL,MOTRIN) 800 MG tablet Take 1 tablet (800 mg total) by mouth every 8 (eight) hours as needed. 07/31/14  Yes Essie Hart, MD  Prenatal Vit-Fe Fumarate-FA (PRENATAL MULTIVITAMIN) TABS tablet Take 1 tablet by mouth daily at 12 noon.   Yes Historical Provider, MD   Social History   Social History  . Marital Status: Single    Spouse Name: n/a  . Number of Children: 0  . 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 Smoking status: Never Smoker   . Smokeless tobacco: Never Used  . Alcohol Use: No  .  Drug Use: No  . Sexual Activity:    Partners: Male    Birth Control/ Protection: Inserts     Comment: nexplanon   Other Topics Concern  . Not on file   Social History Narrative   Lives alone.       Review of Systems  Constitutional: Positive for fever (here only.). Negative for chills.  HENT: Positive for congestion, nosebleeds (min), postnasal drip and sore throat.   Respiratory: Positive for cough. Negative for shortness of breath.   Skin: Negative for rash.       Objective:   Physical Exam  Constitutional: She is oriented to person, place, and time. She appears well-developed and well-nourished. No distress.  HENT:  Head: Normocephalic and atraumatic.  Right Ear: Hearing, tympanic membrane, external ear and ear canal normal.  Left Ear: Hearing, tympanic membrane, external ear and ear canal normal.  Nose: Nose normal.  Mouth/Throat: Oropharynx is clear and moist. No oropharyngeal exudate.  Eyes: Conjunctivae and EOM are normal. Pupils are equal, round, and reactive to light.  Neck:  Min sinus ttp diffusely No LAD in neck.   Cardiovascular: Normal rate, regular rhythm, normal heart sounds and intact distal pulses.   No murmur heard. Pulmonary/Chest: Effort  normal and breath sounds normal. No respiratory distress. She has no wheezes. She has no rhonchi.  Neurological: She is alert and oriented to person, place, and time.  Skin: Skin is warm and dry. No rash noted.  Psychiatric: She has a normal mood and affect. Her behavior is normal.  Vitals reviewed.  Filed Vitals:   04/08/15 1409  BP: 108/80  Pulse: 78  Temp: 99.6 F (37.6 C)  TempSrc: Oral  Resp: 17  Height:  (1.549 m)  Weight: 239 lb (108.41 kg)  SpO2: 98%      Assessment & Plan:  Ashley Barnes is a 30 y.o. female Acute upper respiratory infection  Sore throat  Suspected viral illness, upper respiratory infection. Throat exam reassuring, and borderline fever, but no objective fever at home.  Symptomatic care discussed with saline nasal spray, and if okay with her daughter's pediatrician, can try Mucinex or Mucinex DM, and Sudafed over-the-counter only if needed for congestion. Rest, fluids, and RTC precautions discussed.  No orders of the defined types were placed in this encounter.   Patient Instructions  Saline nasal spray atleast 4 times per day, over the counter mucinex or mucinex DM for cough, cepacol or other cough drop, sudafed if needed, and drink plenty of fluids. Make sure your childs pediatrician is ok with these over the counter medicines for you while breastfeeding.   Return to the clinic or go to the nearest emergency room if any of your symptoms worsen or new symptoms occur.  Upper Respiratory Infection, Adult Most upper respiratory infections (URIs) are a viral infection of the air passages leading to the lungs. A URI affects the nose, throat, and upper air passages. The most common type of URI is nasopharyngitis and is typically referred to as "the common cold." URIs run their course and usually go away on their own. Most of the time, a URI does not require medical attention, but sometimes a bacterial infection in the upper airways can follow a viral infection. This is called a secondary infection. Sinus and middle ear infections are common types of secondary upper respiratory infections. Bacterial pneumonia can also complicate a URI. A URI can worsen asthma and chronic obstructive pulmonary disease (COPD). Sometimes, these complications can require emergency medical care and may be life threatening.  CAUSES Almost all URIs are caused by viruses. A virus is a type of germ and can spread from one person to another.  RISKS FACTORS You may be at risk for a URI if:   You smoke.   You have chronic heart or lung disease.  You have a weakened defense (immune) system.   You are very young or very old.   You have nasal allergies or asthma.  You work in crowded or  poorly ventilated areas.  You work in health care facilities or schools. SIGNS AND SYMPTOMS  Symptoms typically develop 2-3 days after you come in contact with a cold virus. Most viral URIs last 7-10 days. However, viral URIs from the influenza virus (flu virus) can last 14-18 days and are typically more severe. Symptoms may include:   Runny or stuffy (congested) nose.   Sneezing.   Cough.   Sore throat.   Headache.   Fatigue.   Fever.   Loss of appetite.   Pain in your forehead, behind your eyes, and over your cheekbones (sinus pain).  Muscle aches.  DIAGNOSIS  Your health care provider may diagnose a URI by:  Physical exam.  Tests to check that  your symptoms are not due to another condition such as:  Strep throat.  Sinusitis.  Pneumonia.  Asthma. TREATMENT  A URI goes away on its own with time. It cannot be cured with medicines, but medicines may be prescribed or recommended to relieve symptoms. Medicines may help:  Reduce your fever.  Reduce your cough.  Relieve nasal congestion. HOME CARE INSTRUCTIONS   Take medicines only as directed by your health care provider.   Gargle warm saltwater or take cough drops to comfort your throat as directed by your health care provider.  Use a warm mist humidifier or inhale steam from a shower to increase air moisture. This may make it easier to breathe.  Drink enough fluid to keep your urine clear or pale yellow.   Eat soups and other clear broths and maintain good nutrition.   Rest as needed.   Return to work when your temperature has returned to normal or as your health care provider advises. You may need to stay home longer to avoid infecting others. You can also use a face mask and careful hand washing to prevent spread of the virus.  Increase the usage of your inhaler if you have asthma.   Do not use any tobacco products, including cigarettes, chewing tobacco, or electronic cigarettes. If you  need help quitting, ask your health care provider. PREVENTION  The best way to protect yourself from getting a cold is to practice good hygiene.   Avoid oral or hand contact with people with cold symptoms.   Wash your hands often if contact occurs.  There is no clear evidence that vitamin C, vitamin E, echinacea, or exercise reduces the chance of developing a cold. However, it is always recommended to get plenty of rest, exercise, and practice good nutrition.  SEEK MEDICAL CARE IF:   You are getting worse rather than better.   Your symptoms are not controlled by medicine.   You have chills.  You have worsening shortness of breath.  You have brown or red mucus.  You have yellow or brown nasal discharge.  You have pain in your face, especially when you bend forward.  You have a fever.  You have swollen neck glands.  You have pain while swallowing.  You have white areas in the back of your throat. SEEK IMMEDIATE MEDICAL CARE IF:   You have severe or persistent:  Headache.  Ear pain.  Sinus pain.  Chest pain.  You have chronic lung disease and any of the following:  Wheezing.  Prolonged cough.  Coughing up blood.  A change in your usual mucus.  You have a stiff neck.  You have changes in your:  Vision.  Hearing.  Thinking.  Mood. MAKE SURE YOU:   Understand these instructions.  Will watch your condition.  Will get help right away if you are not doing well or get worse.   This information is not intended to replace advice given to you by your health care provider. Make sure you discuss any questions you have with your health care provider.   Document Released: 08/17/2000 Document Revised: 07/08/2014 Document Reviewed: 05/29/2013 Elsevier Interactive Patient Education [employer]

## 2015-06-07 ENCOUNTER — Ambulatory Visit (INDEPENDENT_AMBULATORY_CARE_PROVIDER_SITE_OTHER): Payer: BLUE CROSS/BLUE SHIELD | Admitting: Physician Assistant

## 2015-06-07 VITALS — BP 128/80 | HR 115 | Temp 98.4°F | Resp 17 | Ht 61.0 in | Wt 240.0 lb

## 2015-06-07 DIAGNOSIS — R6889 Other general symptoms and signs: Secondary | ICD-10-CM

## 2015-06-07 MED ORDER — IPRATROPIUM BROMIDE 0.03 % NA SOLN: 2.0000 | Freq: Two times a day (BID) | NASAL | Status: DC

## 2015-06-07 MED ORDER — HYDROCOD POLST-CPM POLST ER 10-8 MG/5ML PO SUER: 5.0000 mL | Freq: Two times a day (BID) | ORAL | Status: DC | PRN

## 2015-06-07 MED ORDER — OSELTAMIVIR PHOSPHATE 75 MG PO CAPS: 75.0000 mg | ORAL_CAPSULE | Freq: Two times a day (BID) | ORAL | Status: DC

## 2015-06-07 NOTE — Progress Notes (Signed)
[facility] [address], [place] [address] [phone]  Date:  06/07/2015   Name:  Ashley Barnes   DOB:  [birth year]   MRN:  [id number]  PCP:  No PCP Per Patient    Chief Complaint: Cough; Nasal Congestion; and Ear Pain   History of Present Illness:  This is a 30 y.o. female with PMH mild int asthma, allergies who is presenting with cough, nasal congestion and otalgia. States started with allergic symptoms 6 days ago - sneezing and nasal congestion. Did not feel bad at that time. Symptoms worsened 2 days ago - started getting feverish and chilled. Having left sided throat pain and left otalgia. Cough is productive of green mucus. Denies sob or wheezing. Checked temp last night and was 101.  Aggravating/alleviating factors: ibuprofen and mucinex History of asthma: yes, mild intermittent, only has to use during illness. Has not had to use since being sick this time. History of env allergies: takes zyrtec year round Tobacco use: no 6510 month old child sick as well.  Review of Systems:  Review of Systems See HPI  Patient Active Problem List   Diagnosis Date Noted  . Postpartum state 07/29/2014  . Gestational hypertension 07/28/2014  . Asthma 12/23/2011  . AR (allergic rhinitis) 12/23/2011    Prior to Admission medications   Medication Sig Start Date End Date Taking? Authorizing Provider  ibuprofen (ADVIL,MOTRIN) 800 MG tablet Take 1 tablet (800 mg total) by mouth every 8 (eight) hours as needed. 07/31/14  Yes Essie HartWalda Pinn, MD  Prenatal Vit-Fe Fumarate-FA (PRENATAL MULTIVITAMIN) TABS tablet Take 1 tablet by mouth daily at 12 noon.   Yes Historical Provider, MD    Allergies  Allergen Reactions  . Corticosteroids Anaphylaxis and Hives  . Penicillins Anaphylaxis  . Zocor [Simvastatin - High Dose] Hives and Rash    Past Surgical History  Procedure Laterality Date  . Wisdom tooth extraction    . 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 Smoking status: Never Smoker   . Smokeless tobacco: Never Used  . Alcohol Use: No    Family History  Problem Relation Age of Onset  . Diabetes Mother 6255  . Hypertension Mother   . Heart disease Father 5334    AMI x 3  . Hypertension Father   . Cancer Paternal Grandfather     colon    Medication list has been reviewed and updated.  Physical Examination:  Physical Exam  Constitutional: She is oriented to person, place, and time. She appears well-developed and well-nourished. No distress.  HENT:  Head: Normocephalic and atraumatic.  Right Ear: Hearing, external ear and ear canal normal. A middle ear effusion is present.  Left Ear: Hearing, external ear and ear canal normal. A middle ear effusion is present.  Nose: Nose normal.  Mouth/Throat: Uvula is midline and mucous membranes are normal. Posterior oropharyngeal erythema present. No oropharyngeal exudate or posterior oropharyngeal edema.  Eyes: Conjunctivae and lids are normal. Right eye exhibits no discharge. Left eye exhibits no discharge. No scleral icterus.  Cardiovascular: Regular rhythm, normal heart sounds and normal pulses.  Tachycardia present.   No murmur heard. Pulmonary/Chest: Effort normal and breath sounds normal. No respiratory distress. She has no wheezes. She has no rhonchi. She has no rales.  Musculoskeletal: Normal range of motion.  Lymphadenopathy:       Head (right side): No submental, no  submandibular and no tonsillar adenopathy present.       Head (left side): No submental, no submandibular and no tonsillar adenopathy present.    She has no cervical adenopathy.  Neurological: She is alert and oriented to person, place, and time.  Skin: Skin is warm, dry and intact. No lesion and no rash noted.  Psychiatric: She has a normal mood and affect. Her speech is normal and behavior is normal. Thought content normal.   BP 128/80 mmHg  Pulse 115  Temp(Src) 98.4 F (36.9 C)  (Oral)  Resp 17  Ht  (1.549 m)  Wt 240 lb (108.863 kg)  BMI 45.37 kg/m2  SpO2 99%  Assessment and Plan:  1. Flu-like symptoms Likely flu. Treat empirically with tamiflu. Focus is on supportive care, see meds prescribed below. Return in 1 week if symptoms do not improve or at any time if symptoms worsen.  - oseltamivir (TAMIFLU) 75 MG capsule; Take 1 capsule (75 mg total) by mouth 2 (two) times daily.  Dispense: 10 capsule; Refill: 0 - ipratropium (ATROVENT) 0.03 % nasal spray; Place 2 sprays into both nostrils 2 (two) times daily.  Dispense: 30 mL; Refill: 0 - chlorpheniramine-HYDROcodone (TUSSIONEX PENNKINETIC ER) 10-8 MG/5ML SUER; Take 5 mLs by mouth every 12 (twelve) hours as needed for cough.  Dispense: 100 mL; Refill: 0   [place] [employer]. Dyke Brackett, MHS [facility]  06/07/2015

## 2015-06-07 NOTE — Patient Instructions (Addendum)
Drink plenty of water (64 oz/day) and get plenty of rest. If you have been prescribed a cough syrup, do not drive or operate heavy machinery while using this medication. If you have been prescribed a nasal spray, use twice a day. tamiflu twice a day for 5 days. If your symptoms are not improving in 1 week, return to clinic.     IF you received an x-ray today, you will receive an invoice from [facility]. Please contact [facility] at [phone] with questions or concerns regarding your invoice.   IF you received labwork today, you will receive an invoice from [facility]. Please contact [facility] at [phone] with questions or concerns regarding your invoice.   Our billing staff will not be able to assist you with questions regarding bills from these companies.  You will be contacted with the lab results as soon as they are available. The fastest way to get your results is to activate your My Chart account. Instructions are located on the last page of this paperwork. If you have not heard from us regarding the results in 2 weeks, please contact this office.

## 2015-09-23 ENCOUNTER — Other Ambulatory Visit: Payer: BLUE CROSS/BLUE SHIELD

## 2015-11-02 ENCOUNTER — Other Ambulatory Visit (INDEPENDENT_AMBULATORY_CARE_PROVIDER_SITE_OTHER): Payer: BLUE CROSS/BLUE SHIELD

## 2015-11-02 DIAGNOSIS — R899 Unspecified abnormal finding in specimens from other organs, systems and tissues: Secondary | ICD-10-CM

## 2015-11-02 LAB — LIPID PANEL
Cholesterol: 147 mg/dL (ref 125–200)
HDL: 36 mg/dL — AB (ref >=46)
LDL CALC: 78 mg/dL (ref <130)
TRIGLYCERIDES: 167 mg/dL — AB (ref <150)
Total CHOL/HDL Ratio: 4.1 Ratio (ref <=5.0)
VLDL: 33 mg/dL — ABNORMAL HIGH (ref <30)

## 2016-01-22 IMAGING — US US OB COMP LESS 14 WK
1 series · 13 of 28 positions shown · non-contrast
Comparison: None.

CLINICAL DATA: First trimester pregnancy with vaginal bleeding.

EXAM:
OBSTETRIC <14 WK US AND TRANSVAGINAL OB US
TECHNIQUE: Both transabdominal and transvaginal ultrasound examinations were
performed for complete evaluation of the gestation as well as the
maternal uterus, adnexal regions, and pelvic cul-de-sac.
Transvaginal technique was performed to assess early pregnancy.

[Series 1: us ob comp less 14 wk · 0.21mm/px · 13 of 33 slices shown]
[im 2/33]
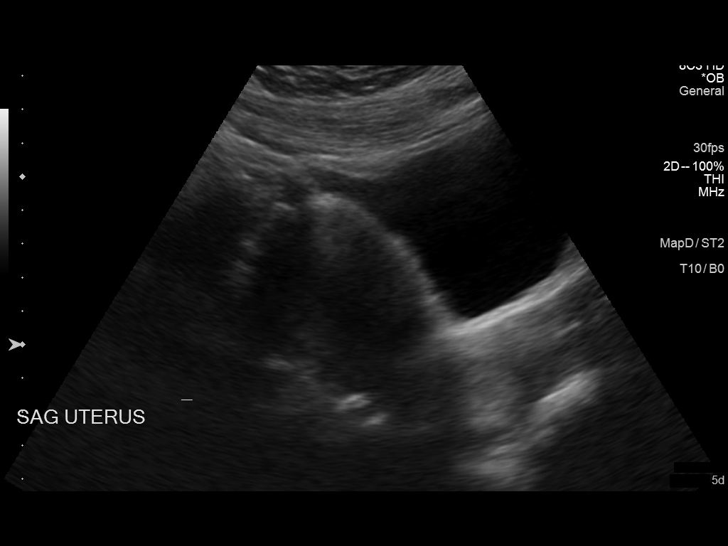
[im 4/33]
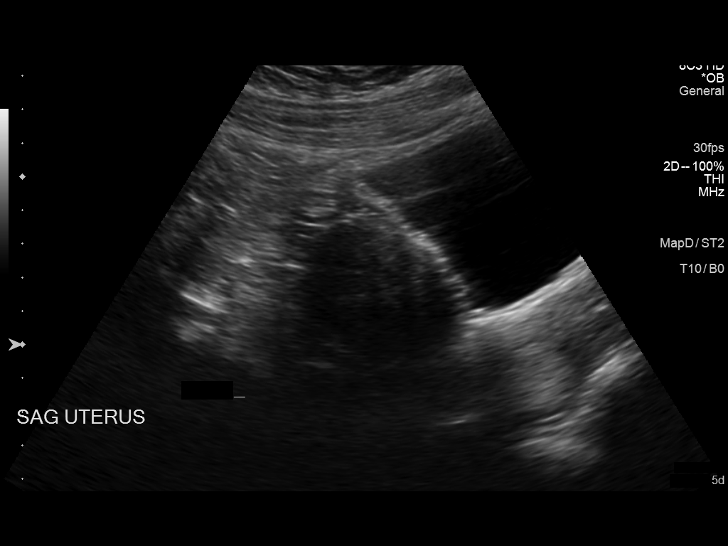
[im 6/33]
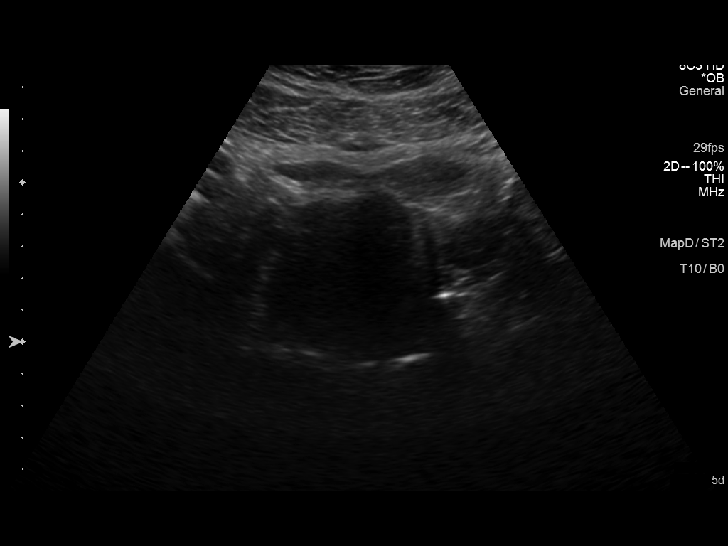
[im 9/33]
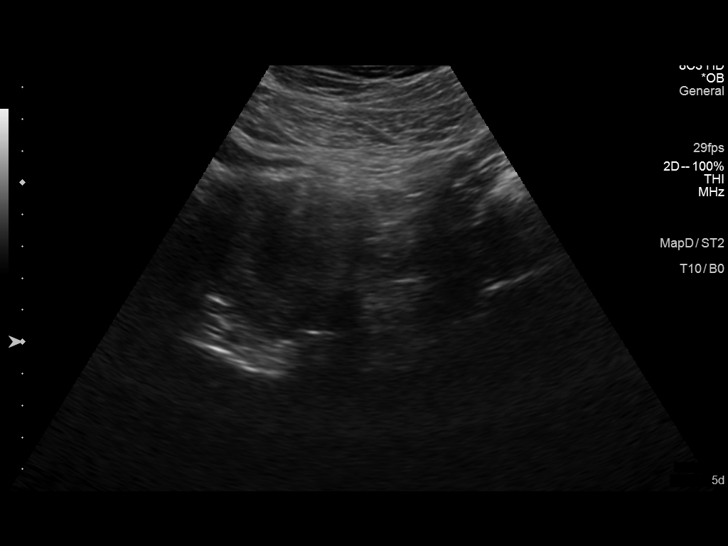
[im 11/33]
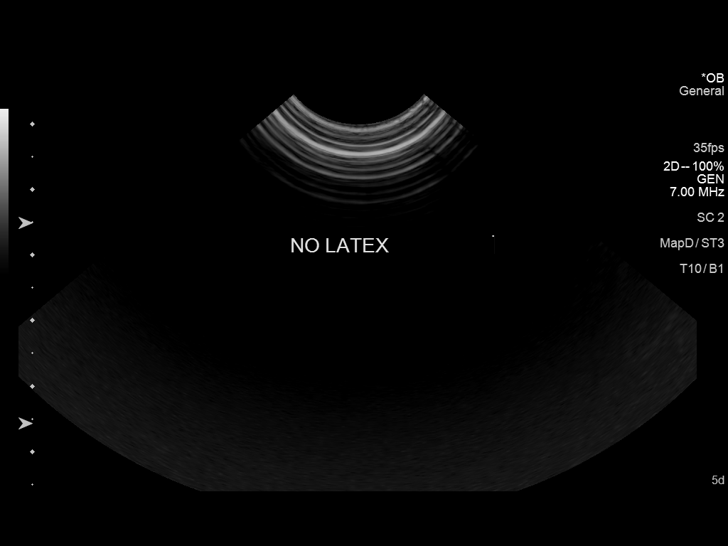
[im 14/33]
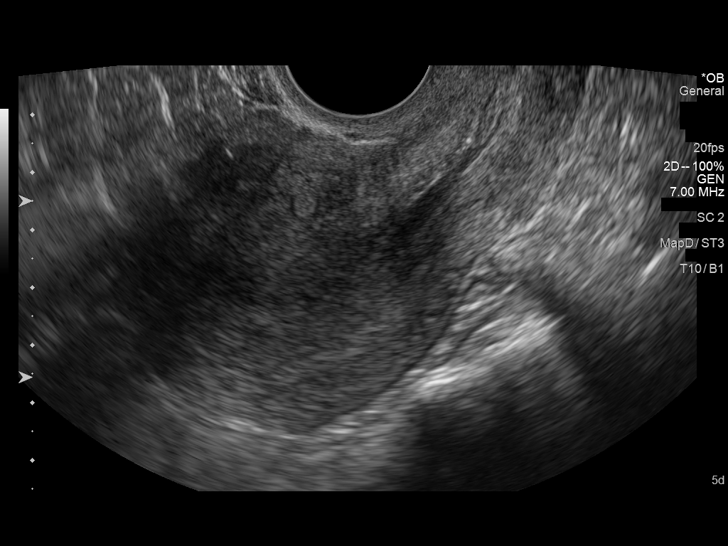
[im 17/33]
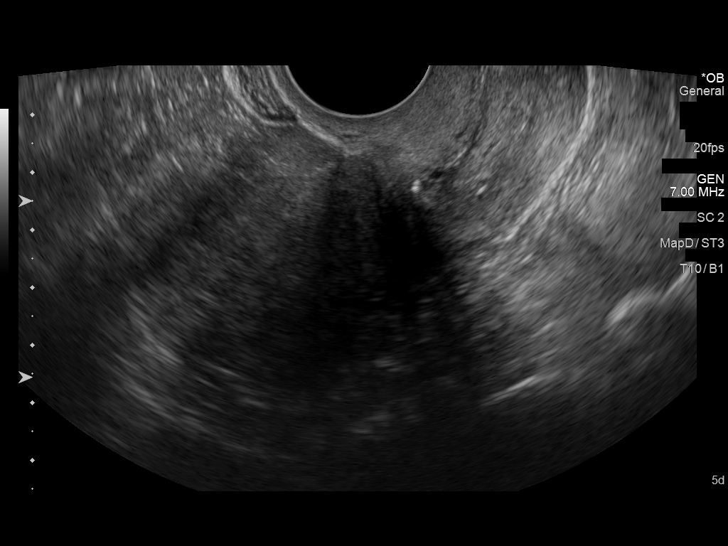
[im 19/33]
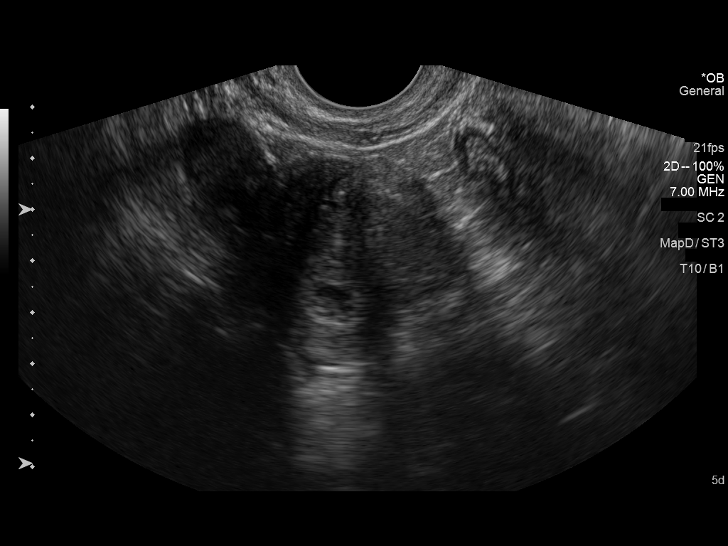
[im 22/33]
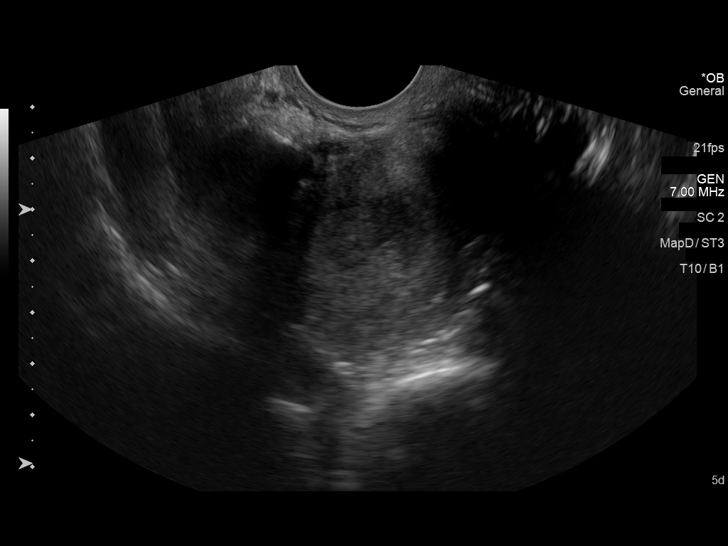
[im 24/33]
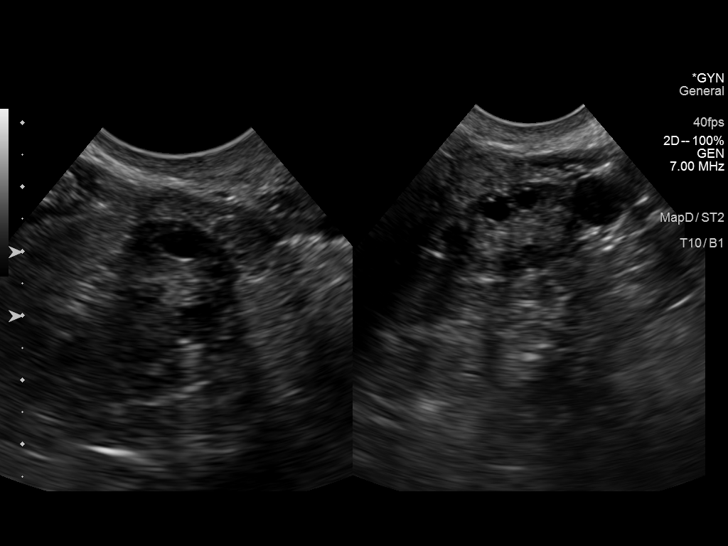
[im 27/33]
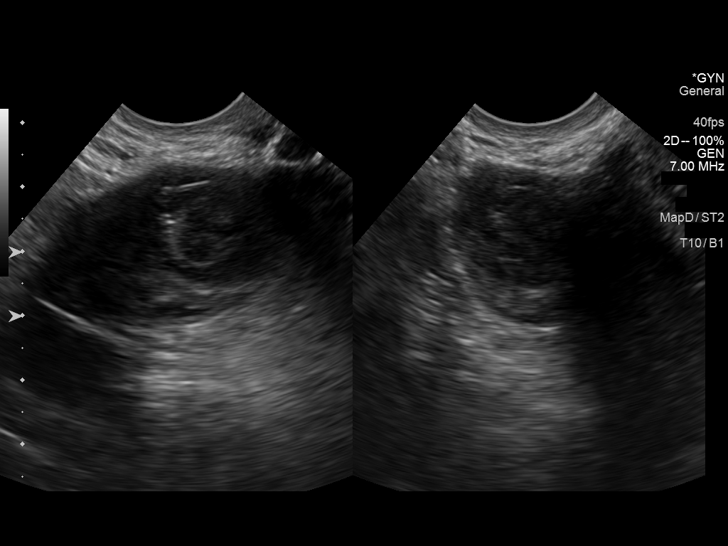
[im 29/33]
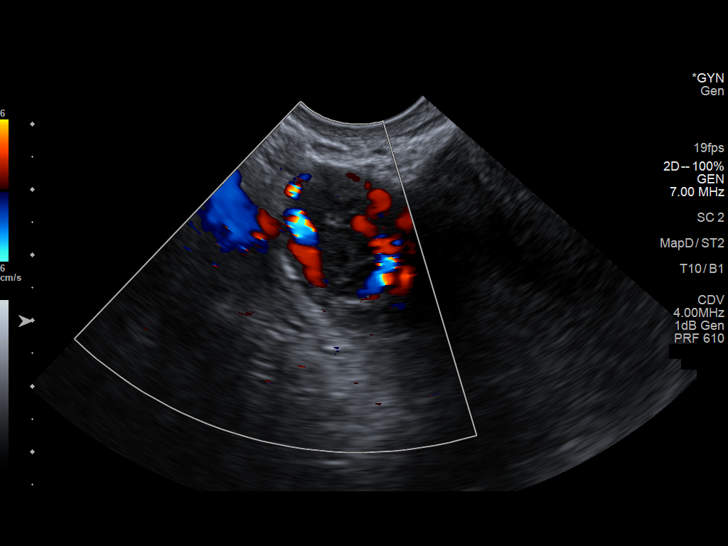
[im 31/33]
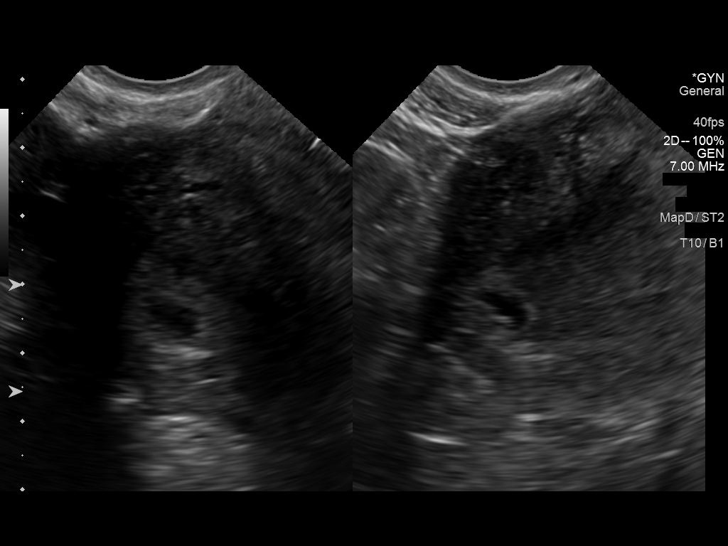

[13 of 28 positions shown; findings below may reference images not displayed]

FINDINGS: Intrauterine gestational sac: Single intrauterine gestational sac.

Yolk sac:  Not visualized.

Embryo:  Not visualized.

MSD:  8.0  mm   5 w   4  d                US EDC: 07/28/2014

Maternal uterus/adnexae: There is subchorionic hemorrhage which the
sonographer felt was large. This is difficult to differentiate from
the endometrium on the images, but I discussed this with her by
telephone. There is no adnexal mass or free pelvic fluid.
IMPRESSION: 1. Probable early intrauterine gestational sac, but no yolk sac,
fetal pole, or cardiac activity yet visualized. Recommend follow-up
quantitative B-HCG levels and follow-up US in 14 days to confirm and
assess viability. This recommendation follows SRU consensus
guidelines: Diagnostic Criteria for Nonviable Pregnancy Early in the
First Trimester. N Engl J Med 4258; [DATE].
2. Subchorionic hemorrhage.
3. These results were called by telephone at the time of
interpretation on 11/29/2013 at [DATE] to Dr. BIRMATA KOBENE , who
verbally acknowledged these results.

## 2016-03-25 ENCOUNTER — Ambulatory Visit: Payer: BLUE CROSS/BLUE SHIELD | Admitting: Certified Nurse Midwife

## 2016-04-05 NOTE — Progress Notes (Signed)
31 y.o. G1P1001 Single  Hispanic Fe here for annual exam. No periods with Nexplanon, no spotting. Some weight gain over past year( 20 pounds) and now on vegetarian diet, and working with exercise. Concerned the Nexplanon maybe part of the issues. Inserted in 10/2014, may consider change to OCP or IUD. Will advise if she decides to go forward. Sees Urgent care as needed. No other health issues today. Still Nursing daughter who is 2!  No LMP recorded.          Sexually active: Yes.    The current method of family planning is nexplanon/implanon.    Exercising: Yes.    cardio & weights Smoker:  no  Health Maintenance: Pap:  03-25-15 neg HPV HR neg, hx of abnormal LSIL 2014 MMG: none Colonoscopy:  none BMD:   none TDaP:  2016 Shingles: no Pneumonia: no Hep C and HIV: HIV neg 2016 Labs: poct urine-neg Self breast exam: done monthly   reports that she has never smoked. She has never used smokeless tobacco. She reports that she does not drink alcohol or use drugs.  Past Medical History:  Diagnosis Date  . Abnormal Pap smear    12-29-09 ASCUS+HPV, 01-04-12 LSIL, 01-10-13 ASCUS +HPV  . Allergy   . Anemia   . Asthma   . Blood type O+    on donor card from [facility]  . Migraines    no aura  . Obesity   . Recurrent sinusitis     Past Surgical History:  Procedure Laterality Date  . 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 WISDOM TOOTH EXTRACTION      Current Outpatient Prescriptions  Medication Sig Dispense Refill  . chlorpheniramine-HYDROcodone (TUSSIONEX PENNKINETIC ER) 10-8 MG/5ML SUER Take 5 mLs by mouth every 12 (twelve) hours as needed for cough. 100 mL 0  . ibuprofen (ADVIL,MOTRIN) 800 MG tablet Take 1 tablet (800 mg total) by mouth every 8 (eight) hours as needed. 60 tablet 3  . ipratropium (ATROVENT) 0.03 % nasal spray Place 2 sprays into both nostrils 2 (two) times daily. 30 mL 0  . oseltamivir (TAMIFLU) 75 MG capsule Take 1 capsule (75 mg total) by mouth 2  (two) times daily. 10 capsule 0  . Prenatal Vit-Fe Fumarate-FA (PRENATAL MULTIVITAMIN) TABS tablet Take 1 tablet by mouth daily at 12 noon.     No current facility-administered medications for this visit.     Family History  Problem Relation Age of Onset  . Diabetes Mother 43  . Hypertension Mother   . Heart disease Father 10    AMI x 3  . Hypertension Father   . Cancer Paternal Grandfather     colon    ROS:  Pertinent items are noted in HPI.  Otherwise, a comprehensive ROS was negative.  Exam:   There were no vitals taken for this visit.   Ht Readings from Last 3 Encounters:  06/07/15 5\' 1"  (1.549 m)  04/08/15 5\' 1"  (1.549 m)  03/25/15 5\' 1"  (1.549 m)    General appearance: alert, cooperative and appears stated age Head: Normocephalic, without obvious abnormality, atraumatic Neck: no adenopathy, supple, symmetrical, trachea midline and thyroid normal to inspection and palpation Lungs: clear to auscultation bilaterally Breasts: normal appearance, no masses or tenderness, No nipple retraction or dimpling, No nipple discharge or bleeding, No axillary or supraclavicular adenopathy Heart: regular rate and rhythm Abdomen: soft, non-tender; no masses,  no organomegaly Extremities: extremities normal, atraumatic, no cyanosis or  edema, Nexplanon palpated in left arm intact Skin: Skin color, texture, turgor normal. No rashes or lesions Lymph nodes: Cervical, supraclavicular, and axillary nodes normal. No abnormal inguinal nodes palpated Neurologic: Grossly normal   Pelvic: External genitalia:  no lesions              Urethra:  normal appearing urethra with no masses, tenderness or lesions              Bartholin's and Skene's: normal                 Vagina: normal appearing vagina with normal color and discharge, no lesions              Cervix: multiparous appearance, no cervical motion tenderness and no lesions              Pap taken: Yes.   Bimanual Exam:  Uterus:  normal size,  contour, position, consistency, mobility, non-tender              Adnexa: normal adnexa and no mass, fullness, tenderness               Rectovaginal: Confirms               Anus:  normal sphincter tone, no lesions  Chaperone present: yes  A:  Well Woman with normal exam  Contraception Nexplanon may change due to concerns with weight gain( 20 pounds in last year) due for removal in 8/19  Screening labs  P:   Reviewed health and wellness pertinent to exam  Discussed risks/benefits of Nexplanon and expectations and side effects. Discussed weight gain not common side effect, but continued work on diet and portion control also important. Discussed risks/benefits of IUD/OCP expectations with bleeding profile. Questions addressed. Will advise if she decides to change.  Pap smear as above with HPVHR   counseled on breast self exam, STD prevention, HIV risk factors and prevention, family planning choices, adequate intake of calcium and vitamin D, diet and exercise  return annually or prn  An After Visit Summary was printed and given to the patient.

## 2016-04-06 ENCOUNTER — Encounter: Payer: Self-pay | Admitting: Certified Nurse Midwife

## 2016-04-06 ENCOUNTER — Ambulatory Visit (INDEPENDENT_AMBULATORY_CARE_PROVIDER_SITE_OTHER): Payer: BLUE CROSS/BLUE SHIELD | Admitting: Certified Nurse Midwife

## 2016-04-06 VITALS — BP 108/70 | HR 68 | Resp 16 | Ht 61.0 in | Wt 260.0 lb

## 2016-04-06 DIAGNOSIS — Z01419 Encounter for gynecological examination (general) (routine) without abnormal findings: Secondary | ICD-10-CM | POA: Diagnosis not present

## 2016-04-06 DIAGNOSIS — Z124 Encounter for screening for malignant neoplasm of cervix: Secondary | ICD-10-CM | POA: Diagnosis not present

## 2016-04-06 DIAGNOSIS — Z0000 Encounter for general adult medical examination without abnormal findings: Secondary | ICD-10-CM | POA: Diagnosis not present

## 2016-04-06 LAB — LIPID PANEL
CHOL/HDL RATIO: 4.4 ratio (ref <5.0)
Cholesterol: 174 mg/dL (ref <200)
HDL: 40 mg/dL — ABNORMAL LOW (ref >50)
LDL Cholesterol: 101 mg/dL — ABNORMAL HIGH (ref <100)
Triglycerides: 165 mg/dL — ABNORMAL HIGH (ref <150)
VLDL: 33 mg/dL — ABNORMAL HIGH (ref <30)

## 2016-04-06 LAB — POCT URINALYSIS DIPSTICK
Bilirubin, UA: NEGATIVE
Glucose, UA: NEGATIVE
Ketones, UA: NEGATIVE
Leukocytes, UA: NEGATIVE
Nitrite, UA: NEGATIVE
PH UA: 5
Protein, UA: NEGATIVE
RBC UA: NEGATIVE
UROBILINOGEN UA: NEGATIVE

## 2016-04-06 LAB — TSH: TSH: 1.55 m[IU]/L

## 2016-04-06 NOTE — Progress Notes (Signed)
Encounter reviewed Avion Patella, MD   

## 2016-04-06 NOTE — Patient Instructions (Signed)

## 2016-04-07 LAB — VITAMIN D 25 HYDROXY (VIT D DEFICIENCY, FRACTURES): VIT D 25 HYDROXY: 27 ng/mL — AB (ref 30–100)

## 2016-04-08 ENCOUNTER — Telehealth: Payer: Self-pay

## 2016-04-08 LAB — IPS PAP TEST WITH REFLEX TO HPV

## 2016-04-08 NOTE — Telephone Encounter (Signed)
Patient is returning a call to Joy. °

## 2016-04-08 NOTE — Telephone Encounter (Signed)
[email or web address]

## 2016-04-08 NOTE — Telephone Encounter (Signed)
Patient notified of results as written by provider 

## 2016-04-08 NOTE — Telephone Encounter (Signed)
-----   Message from Verner Choleborah S Leonard, CNM sent at 04/08/2016  7:31 AM EST ----- Notify patient Pap smear negative 02 Yeast noted on pap if symptomatic  Rx Terazol 7 order not placed

## 2016-04-08 NOTE — Telephone Encounter (Signed)
Left message to call back  

## 2017-04-13 ENCOUNTER — Ambulatory Visit (INDEPENDENT_AMBULATORY_CARE_PROVIDER_SITE_OTHER): Payer: BLUE CROSS/BLUE SHIELD | Admitting: Certified Nurse Midwife

## 2017-04-13 ENCOUNTER — Other Ambulatory Visit: Payer: Self-pay

## 2017-04-13 ENCOUNTER — Other Ambulatory Visit (HOSPITAL_COMMUNITY)
Admission: RE | Admit: 2017-04-13 | Discharge: 2017-04-13 | Disposition: A | Discharge status: Home / Self Care | Payer: BLUE CROSS/BLUE SHIELD | Source: Ambulatory Visit | Attending: Certified Nurse Midwife | Admitting: Certified Nurse Midwife

## 2017-04-13 ENCOUNTER — Encounter: Payer: Self-pay | Admitting: Certified Nurse Midwife

## 2017-04-13 VITALS — BP 110/70 | HR 68 | Resp 16 | Ht 61.0 in | Wt 284.0 lb

## 2017-04-13 DIAGNOSIS — E559 Vitamin D deficiency, unspecified: Secondary | ICD-10-CM

## 2017-04-13 DIAGNOSIS — Z01419 Encounter for gynecological examination (general) (routine) without abnormal findings: Secondary | ICD-10-CM

## 2017-04-13 DIAGNOSIS — Z124 Encounter for screening for malignant neoplasm of cervix: Secondary | ICD-10-CM

## 2017-04-13 DIAGNOSIS — Z713 Dietary counseling and surveillance: Secondary | ICD-10-CM | POA: Diagnosis not present

## 2017-04-13 DIAGNOSIS — Z3046 Encounter for surveillance of implantable subdermal contraceptive: Secondary | ICD-10-CM | POA: Diagnosis not present

## 2017-04-13 NOTE — Progress Notes (Addendum)
32 y.o. G1P1001 Single  Hispanic Fe here for annual exam.  Periods none with Nexplanon. Happy with choice. Planning for Bariatric surgery in March has been in program with diet and counseling for last 6 months. Patient ready to progress forward.  Had labs with [facility]. Not sexually active, no STD screening needed. No health issues today.  No LMP recorded. Patient has had an implant.          Sexually active: No.  The current method of family planning is nexplanon.    Exercising: Yes.    cardio & weight training Smoker:  no  Health Maintenance: Pap:  03-25-15 neg HPV HR neg, 04-06-16 neg History of Abnormal Pap: yes MMG:  none Self Breast exams: yes Colonoscopy:  none BMD:   none TDaP:  2016 Shingles: no Pneumonia: no Hep C and HIV: HIV neg 2016 Labs: will have labs again prior to surgery   reports that  has never smoked. she has never used smokeless tobacco. She reports that she does not drink alcohol or use drugs.  Past Medical History:  Diagnosis Date  . Abnormal Pap smear    12-29-09 ASCUS+HPV, 01-04-12 LSIL, 01-10-13 ASCUS +HPV  . Allergy   . Anemia   . Asthma   . Blood type O+    on donor card from [facility]  . Migraines    no aura  . Obesity   . Recurrent sinusitis     Past Surgical History:  Procedure Laterality Date  . 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 WISDOM TOOTH EXTRACTION      Current Outpatient Medications  Medication Sig Dispense Refill  . Cholecalciferol (VITAMIN D PO) Take by mouth.    . etonogestrel (NEXPLANON) 68 MG IMPL implant 1 each by Subdermal route once.    . Multiple Vitamins-Minerals (MULTIVITAMIN PO) Take by mouth daily.     No current facility-administered medications for this visit.     Family History  Problem Relation Age of Onset  . Diabetes Mother 29  . Hypertension Mother   . Heart disease Father 44       AMI x 3  . Hypertension Father   . Cancer Paternal Grandfather        colon    ROS:   Pertinent items are noted in HPI.  Otherwise, a comprehensive ROS was negative.  Exam:   BP 110/70   Pulse 68   Resp 16   Ht 5\' 1"  (1.549 m)   Wt 284 lb (128.8 kg)   BMI 53.66 kg/m  Height: 5\' 1"  (154.9 cm) Ht Readings from Last 3 Encounters:  04/13/17 5\' 1"  (1.549 m)  04/06/16 5\' 1"  (1.549 m)  06/07/15 5\' 1"  (1.549 m)    General appearance: alert, cooperative and appears stated age Head: Normocephalic, without obvious abnormality, atraumatic Neck: no adenopathy, supple, symmetrical, trachea midline and thyroid normal to inspection and palpation Lungs: clear to auscultation bilaterally Breasts: normal appearance, no masses or tenderness, No nipple retraction or dimpling, No nipple discharge or bleeding, No axillary or supraclavicular adenopathy Heart: regular rate and rhythm Abdomen: soft, non-tender; no masses,  no organomegaly Extremities: extremities normal, atraumatic, no cyanosis or edema, Nexplanon palpated intact in left arm Skin: Skin color, texture, turgor normal. No rashes or lesions Lymph nodes: Cervical, supraclavicular, and axillary nodes normal. No abnormal inguinal nodes palpated Neurologic: Grossly normal   Pelvic: External genitalia:  no lesions  Urethra:  normal appearing urethra with no masses, tenderness or lesions              Bartholin's and Skene's: normal                 Vagina: normal appearing vagina with normal color and discharge, no lesions              Cervix: multiparous appearance, no cervical motion tenderness and no lesions              Pap taken: Yes.   Bimanual Exam:  Uterus:  normal size, contour, position, consistency, mobility, non-tender              Adnexa: normal adnexa and no mass, fullness, tenderness               Rectovaginal: Confirms               Anus:  normal sphincter tone, no lesions  Chaperone present: yes  A:  Well Woman with normal exam  Contraception Nexplanon due for replacement 10/24/17  Morbid Obesity  with gastric sleeve surgery scheduled 3/19  Vitamin D deficiency  P:   Reviewed health and wellness pertinent to exam  Patient unsure if she will do Nexplanon again or IUD, will decide and call in 7/19 with plan, so she will not be with out contraception.  Formed filled out for patient regarding care here for surgery evaluation.  Lab: Vitamin D  Pap smear: yes   counseled on breast self exam, STD prevention, HIV risk factors and prevention, feminine hygiene, adequate intake of calcium and vitamin D, diet and exercise  return annually or prn  An After Visit Summary was printed and given to the patient.

## 2017-04-13 NOTE — Patient Instructions (Signed)

## 2017-04-14 ENCOUNTER — Other Ambulatory Visit: Payer: Self-pay | Admitting: Certified Nurse Midwife

## 2017-04-14 DIAGNOSIS — E559 Vitamin D deficiency, unspecified: Secondary | ICD-10-CM

## 2017-04-14 LAB — CYTOLOGY - PAP
Diagnosis: NEGATIVE
HPV: NOT DETECTED

## 2017-04-14 LAB — VITAMIN D 25 HYDROXY (VIT D DEFICIENCY, FRACTURES): VIT D 25 HYDROXY: 25.5 ng/mL — AB (ref 30.0–100.0)

## 2017-04-18 DIAGNOSIS — Z6841 Body Mass Index (BMI) 40.0 and over, adult: Secondary | ICD-10-CM | POA: Diagnosis not present

## 2017-04-22 ENCOUNTER — Encounter: Payer: Self-pay | Admitting: Certified Nurse Midwife

## 2017-04-25 ENCOUNTER — Telehealth: Payer: Self-pay | Admitting: Certified Nurse Midwife

## 2017-04-25 NOTE — Telephone Encounter (Signed)
-----   Message from Mychart, Generic sent at 04/25/2017 11:26 AM EST -----    Can it be in May? That way it would be after my post op required diet changes by week 6 I should be on full solids. Plus, I want to space out these appointments for work purposes. Thank you.   ----- Message -----  From: Nurse Richarda OverlieKaitlyn E S  Sent: 04/24/2017 8:19 AM EST  To: Ashley KohlGissel Rack  Subject: RE: Visit Follow-Up Question  Ashley Barnes,    We can move your Vitamin D recheck to another date to make it easier on you with having your post operative appointment. Is there another day that may work well for you?    Sincerely,    Nolen MuKaitlyn Sprague, RN    ----- Message -----   From: Ashley Barnes   Sent: 04/22/2017 9:46 AM EST    To: Leota Sauerseborah Leonard, CNM  Subject: Visit Follow-Up Question    Hello, I have my surgery date for the sleeve gastric on March 4th. I have a post on visit scheduled for April 8 the same day as labs with you. Should we change those labs for a later date? I'm having pre-op labs done on February 22.

## 2017-04-25 NOTE — Telephone Encounter (Signed)
Spoke with patient. Patient states that she would like to return to the office on May 24th as she has this day off with work. Unable to come before. Appointment moved to May 24th at 8:30 am. Patient is agreeable to date and time.  Routing to provider for final review. Patient agreeable to disposition. Will close encounter.

## 2017-04-28 DIAGNOSIS — Z01818 Encounter for other preprocedural examination: Secondary | ICD-10-CM | POA: Diagnosis not present

## 2017-05-08 DIAGNOSIS — Z79899 Other long term (current) drug therapy: Secondary | ICD-10-CM | POA: Diagnosis not present

## 2017-05-08 DIAGNOSIS — J45909 Unspecified asthma, uncomplicated: Secondary | ICD-10-CM | POA: Diagnosis not present

## 2017-05-08 DIAGNOSIS — K317 Polyp of stomach and duodenum: Secondary | ICD-10-CM | POA: Diagnosis not present

## 2017-05-08 DIAGNOSIS — Z6841 Body Mass Index (BMI) 40.0 and over, adult: Secondary | ICD-10-CM | POA: Diagnosis not present

## 2017-05-11 MED ORDER — CELECOXIB 200 MG PO CAPS
200.00 | ORAL_CAPSULE | ORAL | Status: DC
Start: 2017-05-09 — End: 2017-05-11

## 2017-05-11 MED ORDER — SIMETHICONE 40 MG/0.6ML PO SUSP
80.00 | ORAL | Status: DC
Start: ? — End: 2017-05-11

## 2017-05-11 MED ORDER — SODIUM CHLORIDE 0.9 % IV SOLN
1000.00 | INTRAVENOUS | Status: DC
Start: ? — End: 2017-05-11

## 2017-05-11 MED ORDER — HEPARIN SODIUM (PORCINE) 5000 UNIT/ML IJ SOLN
INTRAMUSCULAR | Status: DC
Start: ? — End: 2017-05-11

## 2017-05-11 MED ORDER — GENERIC EXTERNAL MEDICATION
0.04 | Status: DC
Start: ? — End: 2017-05-11

## 2017-05-11 MED ORDER — GENERIC EXTERNAL MEDICATION
Status: DC
Start: ? — End: 2017-05-11

## 2017-05-11 MED ORDER — NALOXONE HCL 0.4 MG/ML IJ SOLN
0.40 | INTRAMUSCULAR | Status: DC
Start: ? — End: 2017-05-11

## 2017-05-11 MED ORDER — SODIUM CHLORIDE 0.9 % IV SOLN
INTRAVENOUS | Status: DC
Start: ? — End: 2017-05-11

## 2017-05-11 MED ORDER — GENERIC EXTERNAL MEDICATION
.08 | Status: DC
Start: ? — End: 2017-05-11

## 2017-05-11 MED ORDER — HYDRALAZINE HCL 20 MG/ML IJ SOLN
10.00 | INTRAMUSCULAR | Status: DC
Start: ? — End: 2017-05-11

## 2017-05-11 MED ORDER — GABAPENTIN 300 MG PO CAPS
300.00 | ORAL_CAPSULE | ORAL | Status: DC
Start: 2017-05-09 — End: 2017-05-11

## 2017-05-11 MED ORDER — SUCRALFATE 1 GM/10ML PO SUSP
1.00 | ORAL | Status: DC
Start: 2017-05-09 — End: 2017-05-11

## 2017-05-11 MED ORDER — CLONIDINE HCL 0.1 MG PO TABS
.10 | ORAL_TABLET | ORAL | Status: DC
Start: ? — End: 2017-05-11

## 2017-05-11 MED ORDER — ONDANSETRON HCL 4 MG PO TABS
4.00 | ORAL_TABLET | ORAL | Status: DC
Start: ? — End: 2017-05-11

## 2017-05-11 MED ORDER — ONDANSETRON HCL 4 MG/2ML IJ SOLN
8.00 | INTRAMUSCULAR | Status: DC
Start: ? — End: 2017-05-11

## 2017-05-11 MED ORDER — PANTOPRAZOLE SODIUM 40 MG PO TBEC
40.00 | DELAYED_RELEASE_TABLET | ORAL | Status: DC
Start: 2017-05-09 — End: 2017-05-11

## 2017-05-16 ENCOUNTER — Encounter: Payer: Self-pay | Admitting: Certified Nurse Midwife

## 2017-05-16 ENCOUNTER — Ambulatory Visit: Payer: BLUE CROSS/BLUE SHIELD | Admitting: Certified Nurse Midwife

## 2017-05-16 ENCOUNTER — Other Ambulatory Visit: Payer: Self-pay

## 2017-05-16 VITALS — BP 102/66 | HR 68 | Resp 16 | Ht 61.0 in | Wt 264.0 lb

## 2017-05-16 DIAGNOSIS — B373 Candidiasis of vulva and vagina: Secondary | ICD-10-CM

## 2017-05-16 DIAGNOSIS — N898 Other specified noninflammatory disorders of vagina: Secondary | ICD-10-CM | POA: Diagnosis not present

## 2017-05-16 DIAGNOSIS — B379 Candidiasis, unspecified: Secondary | ICD-10-CM

## 2017-05-16 DIAGNOSIS — T3695XA Adverse effect of unspecified systemic antibiotic, initial encounter: Secondary | ICD-10-CM

## 2017-05-16 DIAGNOSIS — B3731 Acute candidiasis of vulva and vagina: Secondary | ICD-10-CM

## 2017-05-16 NOTE — Progress Notes (Signed)
32 y.o. Single Hispanic female G1P1001 here with complaint of vaginal symptoms of itching, burning, and increase discharge with odor.Marland Kitchen. Describes discharge as brown discharge. Onset of symptoms 4 days ago. Denies new personal products, but has been on antibiotics from recent gastric sleeve surgery. Has lost 20 pounds. no STD concerns. Urinary symptoms none . Contraception is Nexplanon.  ROS  Pertinent  To HPI  O:Healthy female WDWN Affect: normal, orientation x 3  Exam:Skin: warm and dry Abdomen:soft, non tender  Inguinal Lymph nodes: no enlargement or tenderness Pelvic exam: External genital: normal female with copious yeast type discharge and exudate. Wet prep taken BUS: negative Vagina: copious brown beige discharge noted. Ph:4.0   ,Wet prep taken, Affirm taken Cervix: normal, non tender, no CMT Uterus: normal, non tender Adnexa:normal, non tender, no masses or fullness noted   Wet Prep results: Koh, Saline+ yeast, unable to confirm BV   A:Normal pelvic exam Yeast vaginitis/vulvitis antibiotic induced, has Rx for Diflucan from surgeon, but has not picked up R/O BV   P:Discussed findings of yeast and etiology. Discussed Aveeno or baking soda sitz bath for comfort. Avoid moist clothes  for extended period of time. Start on Diflucan as directed. Will treat BV if indicated. Lab: affirm  Rv prn

## 2017-05-17 LAB — VAGINITIS/VAGINOSIS, DNA PROBE
CANDIDA SPECIES: POSITIVE — AB
Gardnerella vaginalis: NEGATIVE
TRICHOMONAS VAG: NEGATIVE

## 2017-05-24 DIAGNOSIS — E860 Dehydration: Secondary | ICD-10-CM | POA: Diagnosis not present

## 2017-06-07 DIAGNOSIS — Z713 Dietary counseling and surveillance: Secondary | ICD-10-CM | POA: Diagnosis not present

## 2017-06-12 ENCOUNTER — Other Ambulatory Visit: Payer: BLUE CROSS/BLUE SHIELD

## 2017-06-12 DIAGNOSIS — Z6841 Body Mass Index (BMI) 40.0 and over, adult: Secondary | ICD-10-CM

## 2017-06-12 DIAGNOSIS — K912 Postsurgical malabsorption, not elsewhere classified: Secondary | ICD-10-CM | POA: Insufficient documentation

## 2017-06-12 DIAGNOSIS — Z9884 Bariatric surgery status: Secondary | ICD-10-CM | POA: Insufficient documentation

## 2017-07-28 ENCOUNTER — Other Ambulatory Visit: Payer: Self-pay

## 2017-07-28 ENCOUNTER — Other Ambulatory Visit (INDEPENDENT_AMBULATORY_CARE_PROVIDER_SITE_OTHER): Payer: BLUE CROSS/BLUE SHIELD

## 2017-07-28 DIAGNOSIS — R7989 Other specified abnormal findings of blood chemistry: Secondary | ICD-10-CM | POA: Diagnosis not present

## 2017-07-28 DIAGNOSIS — E559 Vitamin D deficiency, unspecified: Secondary | ICD-10-CM | POA: Diagnosis not present

## 2017-07-29 LAB — VITAMIN D 25 HYDROXY (VIT D DEFICIENCY, FRACTURES): Vit D, 25-Hydroxy: 32.6 ng/mL (ref 30.0–100.0)

## 2017-08-18 DIAGNOSIS — Z6841 Body Mass Index (BMI) 40.0 and over, adult: Secondary | ICD-10-CM | POA: Diagnosis not present

## 2017-08-18 DIAGNOSIS — Z9884 Bariatric surgery status: Secondary | ICD-10-CM | POA: Diagnosis not present

## 2017-08-18 DIAGNOSIS — Z713 Dietary counseling and surveillance: Secondary | ICD-10-CM | POA: Diagnosis not present

## 2017-09-08 ENCOUNTER — Encounter: Payer: Self-pay | Admitting: Certified Nurse Midwife

## 2017-09-21 ENCOUNTER — Encounter: Payer: Self-pay | Admitting: Certified Nurse Midwife

## 2017-09-21 ENCOUNTER — Telehealth: Payer: Self-pay | Admitting: Emergency Medicine

## 2017-09-21 DIAGNOSIS — Z30017 Encounter for initial prescription of implantable subdermal contraceptive: Secondary | ICD-10-CM

## 2017-09-21 DIAGNOSIS — Z3046 Encounter for surveillance of implantable subdermal contraceptive: Secondary | ICD-10-CM

## 2017-09-21 NOTE — Telephone Encounter (Signed)
Patient sent Ashley Barnes CNM a [name] message with request for advise regarding vaginal bleeding.  Nexplanon placed 11/2014.   Hey! I ended spotting from 7/4 until 7/10 just wiping old brown blood. I started bleeding again today, it's light pink today and cramping. Outside of the weight loss, could this be cause it being close to the end of the life the implant? 3 years is in Sept.    Message left to return call to [place] at [phone].

## 2017-09-21 NOTE — Telephone Encounter (Signed)
Call to patient for clinical triage.  Will close mychart encounter.

## 2017-09-21 NOTE — Telephone Encounter (Signed)
Patient returned call.  Currently sexually active. Denies STD exposure. States she knows it is normal for irregular bleeding with nexplanon, but wants to confirm with Leota Sauerseborah Leonard CNM.  Has lost weight and stopped breastfeeding after 3 years.   I advised patient to keep calendar of menses, call back if bleeding continues or is soaking pads or if any abdominal pain or cramping. She verbalizes understanding of symptoms of bleeding emergencies and when to call back to office or go to urgent care or emergency room as needed. Patient is agreeable and verbalizes understanding of plan and will call back if condition changes or worsens.  Patient would like to plan for nexplanon removal and replacement at this time. Advised would route message to provider and return call with response. Pt agreeable.

## 2017-09-21 NOTE — Telephone Encounter (Signed)
Bleeding is probably occurring due to Nexplanon due for exchange in 8/19. Put in 2016. Agree with instructions, OK to schedule replacement with me and does not need to wait to exact day.

## 2017-09-22 NOTE — Telephone Encounter (Signed)
Call to patient and message discussed.  Pt agreeable to plan.  Scheduled for 10/06/17 at 1400 for nexplanon removal and reinsertion.   Orders placed for precert.

## 2017-10-03 DIAGNOSIS — Z713 Dietary counseling and surveillance: Secondary | ICD-10-CM | POA: Diagnosis not present

## 2017-10-03 DIAGNOSIS — Z9884 Bariatric surgery status: Secondary | ICD-10-CM | POA: Diagnosis not present

## 2017-10-06 ENCOUNTER — Encounter: Payer: Self-pay | Admitting: Certified Nurse Midwife

## 2017-10-06 ENCOUNTER — Other Ambulatory Visit: Payer: Self-pay

## 2017-10-06 ENCOUNTER — Ambulatory Visit (INDEPENDENT_AMBULATORY_CARE_PROVIDER_SITE_OTHER): Payer: BLUE CROSS/BLUE SHIELD | Admitting: Certified Nurse Midwife

## 2017-10-06 DIAGNOSIS — Z3046 Encounter for surveillance of implantable subdermal contraceptive: Secondary | ICD-10-CM

## 2017-10-06 DIAGNOSIS — Z30017 Encounter for initial prescription of implantable subdermal contraceptive: Secondary | ICD-10-CM

## 2017-10-06 NOTE — Patient Instructions (Signed)
Nexplanon Instructions After Insertion  Keep bandage clean and dry for 24 hours  May use ice/Tylenol/Ibuprofen for soreness or pain  If you develop fever, drainage or increased warmth from incision site-contact office immediately   

## 2017-10-06 NOTE — Progress Notes (Signed)
Review of Systems  Constitutional: Negative.   Skin: Negative.   Psychiatric/Behavioral: Negative.     32 yrs Hispanic Single female G1P1001  presents for Nexplanon removal and new insertion. Patient had Gastric sleeve and has lost 70 pounds to date. No health concerns.   LMP7/18/19. Contraception:     Nexplanon replacement planned. Questions addressed.  Consent signed. Requests same as above.  HPI neg.  Exam: Normal affect, Orientation x 3 Healthy female WDWN  Procedure: Patient placed supine on exam table with herleft arm   flexed at the elbow and externally rotated.The insertion site was identified on the inner side of upper arm.The device was palpated. Incision site for removal was marked. The removal site was cleansed with betadine solution and 1 ml of 1% Lidocaine Lot # [id number], exp. 1/23 was injected at the incision site. A small 2 mm incision was made at the distal end of the implant.The Nexplanon implant was grasped with curved forceps and removed gently intact. Implant shown to patient and discarded. Due to the weight loss, area for re-insertion will be in same area, no obvious contraindications. Excessive skin noted, site re marked and cleansed with Betadine x 3 ml of 1 % Lidocaine was injected to site for insertion. New Nexplanon Lot [id number] exp 01/18/20 was inserted. Nexplanon was immediately palpated and patient also palpated. The incision was closed with a steri strips.  No active bleeding was noted. A small adhesive bandage placed over the insertionl site.  A pressure bandage was place over the site. Patient tolerated procedure well.  Assessment:  Nexaplon Removal/Nexplanon  insertion   Plan :Instructions and warning signs and symptoms given.  Remove bandage in 3 days. Questions addressed    Return Visit  4 weeks, prn

## 2017-10-20 DIAGNOSIS — K912 Postsurgical malabsorption, not elsewhere classified: Secondary | ICD-10-CM | POA: Diagnosis not present

## 2017-10-20 DIAGNOSIS — Z903 Acquired absence of stomach [part of]: Secondary | ICD-10-CM | POA: Diagnosis not present

## 2017-11-09 ENCOUNTER — Encounter: Payer: Self-pay | Admitting: Certified Nurse Midwife

## 2017-11-09 ENCOUNTER — Other Ambulatory Visit: Payer: Self-pay

## 2017-11-09 ENCOUNTER — Ambulatory Visit (INDEPENDENT_AMBULATORY_CARE_PROVIDER_SITE_OTHER): Payer: BLUE CROSS/BLUE SHIELD | Admitting: Certified Nurse Midwife

## 2017-11-09 VITALS — BP 110/70 | HR 68 | Resp 16 | Ht 61.0 in | Wt 210.0 lb

## 2017-11-09 DIAGNOSIS — Z903 Acquired absence of stomach [part of]: Secondary | ICD-10-CM | POA: Diagnosis not present

## 2017-11-09 DIAGNOSIS — Z3046 Encounter for surveillance of implantable subdermal contraceptive: Secondary | ICD-10-CM | POA: Diagnosis not present

## 2017-11-09 DIAGNOSIS — K912 Postsurgical malabsorption, not elsewhere classified: Secondary | ICD-10-CM | POA: Diagnosis not present

## 2017-11-09 NOTE — Progress Notes (Signed)
Review of Systems  Constitutional: Negative.   HENT: Negative.   Eyes: Negative.   Respiratory: Negative.   Cardiovascular: Negative.   Gastrointestinal: Negative.   Genitourinary: Negative.        No period since Nexplanon insertion  Musculoskeletal: Negative.   Skin: Negative.        Nexplanon site healed after band removed  Neurological: Negative.   Endo/Heme/Allergies: Negative.   Psychiatric/Behavioral: Negative.     32 y.o. Single Hispanic female G1P1001 here for follow up of Nexplanon inserted on 10/06/17. Patient denies any problems with healing or significant discomfort after procedure. Noted some expected bruising due to removal and reinsertion of new implant. Has had no menses since insertion. Denies any warning signs with use. Happy with choice again. Has lost another 7 pounds since last visit with her gastric sleeve surgery. No health concerns today.   O: Healthy WD,WN female Affect: normal  Skin:warm and dry Nexplanon site in left arm palpated with implant noted intact and appropriate scar appearance, non tender.  A:Nexplanon surveillance normal Weight loss in process with gastric sleeve  P: Discussed findings of normal appearance of Nexplanon site and implant is intact. Period expectations reviewed. Warning signs reviewed with use. Questions addressed.  Rv prn, aex

## 2018-04-17 ENCOUNTER — Ambulatory Visit: Payer: BLUE CROSS/BLUE SHIELD | Admitting: Certified Nurse Midwife

## 2018-04-23 DIAGNOSIS — Z903 Acquired absence of stomach [part of]: Secondary | ICD-10-CM | POA: Diagnosis not present

## 2018-04-23 DIAGNOSIS — K912 Postsurgical malabsorption, not elsewhere classified: Secondary | ICD-10-CM | POA: Diagnosis not present

## 2018-05-03 ENCOUNTER — Other Ambulatory Visit: Payer: Self-pay

## 2018-05-03 ENCOUNTER — Ambulatory Visit (INDEPENDENT_AMBULATORY_CARE_PROVIDER_SITE_OTHER): Payer: BLUE CROSS/BLUE SHIELD | Admitting: Certified Nurse Midwife

## 2018-05-03 ENCOUNTER — Encounter: Payer: Self-pay | Admitting: Certified Nurse Midwife

## 2018-05-03 VITALS — BP 102/62 | HR 68 | Resp 16 | Ht 60.75 in | Wt 198.0 lb

## 2018-05-03 DIAGNOSIS — Z3046 Encounter for surveillance of implantable subdermal contraceptive: Secondary | ICD-10-CM

## 2018-05-03 DIAGNOSIS — Z01419 Encounter for gynecological examination (general) (routine) without abnormal findings: Secondary | ICD-10-CM | POA: Diagnosis not present

## 2018-05-03 NOTE — Progress Notes (Signed)
33 y.o. G1P1001 Single Hispanic Fe here for annual exam. Periods none, with Nexplanon. Had labs for GI and all normal except for Vitamin D. Has follow up in one week which has been one year from surgery day. Has a new job with [employer]. Still working on diet and now down 90 pounds! Eating good balanced diet. Has noted ? Hemorrhoid and slight blood once occurrence. Started on stool softener with no more issues. No partner change, no STD screening. No other health issues today.  No LMP recorded. Patient has had an implant.          Sexually active: Yes.    The current method of family planning is nexplanon.    Exercising: Yes.    4-6 days a week Smoker:  no  Review of Systems  Constitutional: Negative.   HENT: Negative.   Eyes: Negative.   Respiratory: Negative.   Cardiovascular: Negative.   Gastrointestinal: Positive for blood in stool.  Genitourinary: Negative.   Musculoskeletal: Negative.   Skin: Negative.   Neurological: Negative.   Endo/Heme/Allergies: Negative.   Psychiatric/Behavioral: Negative.     Health Maintenance: Pap:  04-06-16 neg, 04-13-17 neg HPV HR neg History of Abnormal Pap: yes MMG:  none Self Breast exams: yes Colonoscopy:  none BMD:   none TDaP:  2016 Shingles: no Pneumonia: no Hep C and HIV: HIV neg 2016 Labs: no   reports that she has never smoked. She has never used smokeless tobacco. She reports that she does not drink alcohol or use drugs.  Past Medical History:  Diagnosis Date  . Abnormal Pap smear    12-29-09 ASCUS+HPV, 01-04-12 LSIL, 01-10-13 ASCUS +HPV  . Allergy   . Anemia   . Asthma   . Blood type O+    on donor card from [facility]  . Migraines    no aura  . Obesity   . Recurrent sinusitis     Past Surgical History:  Procedure Laterality Date  . 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 LAPAROSCOPIC GASTRIC SLEEVE RESECTION    . nexplanon      inserted 11-20-14, 10-06-17 removed & new one inserted  . WISDOM TOOTH EXTRACTION       Current Outpatient Medications  Medication Sig Dispense Refill  . Cholecalciferol (VITAMIN D PO) Take by mouth.    . etonogestrel (NEXPLANON) 68 MG IMPL implant 1 each by Subdermal route once.    . Multiple Vitamins-Minerals (MULTIVITAMIN PO) Take by mouth daily.     No current facility-administered medications for this visit.     Family History  Problem Relation Age of Onset  . Diabetes Mother 5  . Hypertension Mother   . Heart disease Father 49       AMI x 3  . Hypertension Father   . Cancer Paternal Grandfather        colon    ROS:  Pertinent items are noted in HPI.  Otherwise, a comprehensive ROS was negative.  Exam:   BP 102/62   Pulse 68   Resp 16   Ht 5' 0.75" (1.543 m)   Wt 198 lb (89.8 kg)   BMI 37.72 kg/m  Height: 5' 0.75" (154.3 cm) Ht Readings from Last 3 Encounters:  05/03/18 5' 0.75" (1.543 m)  11/09/17 5\' 1"  (1.549 m)  10/06/17 5\' 1"  (1.549 m)    General appearance: alert, cooperative and appears stated age Head: Normocephalic, without obvious abnormality, atraumatic Neck: no adenopathy, supple, symmetrical,  trachea midline and thyroid normal to inspection and palpation Lungs: clear to auscultation bilaterally Breasts: normal appearance, no masses or tenderness, No nipple retraction or dimpling, No nipple discharge or bleeding, No axillary or supraclavicular adenopathy Heart: regular rate and rhythm Abdomen: soft, non-tender; no masses,  no organomegaly Extremities: extremities normal, atraumatic, no cyanosis or edema, Nexplanon palpated in left arm intact Skin: Skin color, texture, turgor normal. No rashes or lesions Lymph nodes: Cervical, supraclavicular, and axillary nodes normal. No abnormal inguinal nodes palpated Neurologic: Grossly normal   Pelvic: External genitalia:  no lesions,  Normal female              Urethra:  normal appearing urethra with no masses, tenderness or lesions              Bartholin's and Skene's: normal                  Vagina: normal appearing vagina with normal color and discharge, no lesions              Cervix: no cervical motion tenderness, no lesions and normal appearance              Pap taken: No. Bimanual Exam:  Uterus:  normal size, contour, position, consistency, mobility, non-tender and anteverted              Adnexa: normal adnexa and no mass, fullness, tenderness               Rectovaginal: Confirms               Anus:  normal sphincter tone, no lesions  Chaperone present: yes  A:  Well Woman with normal exam  Contraception Nexplanon  Weight loss in progress from gastric sleeve surgery 90 pounds still in follow up [facility]    P:   Reviewed health and wellness pertinent to exam  Denies problems with Nexplanon and aware of bleeding profile  Continue follow up with MD as indicated  Pap smear: no   counseled on breast self exam, feminine hygiene, adequate intake of calcium and vitamin D, diet and exercise  return annually or prn  An After Visit Summary was printed and given to the patient.

## 2018-05-03 NOTE — Patient Instructions (Signed)

## 2018-07-21 ENCOUNTER — Encounter (HOSPITAL_COMMUNITY): Payer: Self-pay | Admitting: Emergency Medicine

## 2018-07-21 ENCOUNTER — Other Ambulatory Visit: Payer: Self-pay

## 2018-07-21 ENCOUNTER — Ambulatory Visit (HOSPITAL_COMMUNITY)
Admission: EM | Admit: 2018-07-21 | Discharge: 2018-07-21 | Disposition: A | Discharge status: Home / Self Care | Payer: BLUE CROSS/BLUE SHIELD | Attending: Family Medicine | Admitting: Family Medicine

## 2018-07-21 DIAGNOSIS — R21 Rash and other nonspecific skin eruption: Secondary | ICD-10-CM | POA: Diagnosis not present

## 2018-07-21 MED ORDER — CETIRIZINE HCL 10 MG PO CAPS: 10.0000 mg | ORAL_CAPSULE | Freq: Every day | ORAL | 0 refills | Status: DC

## 2018-07-21 NOTE — Discharge Instructions (Signed)
Daily cetirizine Benadryl to supplement at night time  Monitor for rash to resolve over next 4-5 days  Follow up if rash worsening, developing symptoms of fever, shortness of breath, nausea, vomiting, involving mouth

## 2018-07-21 NOTE — ED Notes (Signed)
Patient able to ambulate independently  

## 2018-07-21 NOTE — ED Triage Notes (Signed)
Pt here for rash to legs, arms and neck; denies itching

## 2018-07-21 NOTE — ED Provider Notes (Addendum)
[facility]    CSN: [id number] Arrival date & time: 07/21/18  1130     History   Chief Complaint Chief Complaint  Patient presents with  . Rash    appt 1150    HPI Ashley Barnes is a 33 y.o. female history of asthma, allergic rhinitis, presenting today for evaluation of a rash.  Patient states that yesterday she began to develop red rash to her lower legs.  When she woke up this morning she also noticed the rash on her arms as well as neck.  She initially thought it was irritation related to shaving, but now that it involves her upper extremities and neck she feels this is less likely.  She denies any new soaps, lotions, detergents, foods or medicines.  Denies any hygiene products.  Rash is mildly itchy, denies pain.  Denies close contacts with similar symptoms.  HPI  Past Medical History:  Diagnosis Date  . Abnormal Pap smear    12-29-09 ASCUS+HPV, 01-04-12 LSIL, 01-10-13 ASCUS +HPV  . Allergy   . Anemia   . Asthma   . Blood type O+    on donor card [facility]  . Migraines    no aura  . Obesity   . Recurrent sinusitis     Patient Active Problem List   Diagnosis Date Noted  . 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 Asthma 12/23/2011  . AR (allergic rhinitis) 12/23/2011    Past Surgical History:  Procedure Laterality Date  . 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 LAPAROSCOPIC GASTRIC SLEEVE RESECTION    . nexplanon      inserted 11-20-14, 10-06-17 removed & new one inserted  . WISDOM TOOTH EXTRACTION      OB History    Gravida  1   Para  1   Term  1   Preterm  0   AB  0   Living  1     SAB  0   TAB  0   Ectopic  0   Multiple  0   Live Births  1            Home Medications    Prior to Admission medications   Medication Sig Start Date End Date Taking? Authorizing Provider  Cetirizine HCl 10 MG CAPS Take 1 capsule (10 mg total) by mouth daily for 10 days. 07/21/18 07/31/18  Wieters, Hallie C, PA-C   Cholecalciferol (VITAMIN D PO) Take by mouth.    [provider]  etonogestrel (NEXPLANON) 68 MG IMPL implant 1 each by Subdermal route once.    [provider]  Multiple Vitamins-Minerals (MULTIVITAMIN PO) Take by mouth daily.    [provider]    Family History Family History  Problem Relation Age of Onset  . Diabetes Mother 74  . Hypertension Mother   . Heart disease Father 52       AMI x 3  . Hypertension Father   . Cancer Paternal Grandfather        colon    Social History Social History   Tobacco Use  . Smoking status: Never Smoker  . Smokeless tobacco: Never Used  Substance Use Topics  . Alcohol use: No    Alcohol/week: 0.0 standard drinks  . Drug use: No     Allergies   Corticosteroids; Penicillins; Ursodiol; and Zocor [simvastatin - high dose]   Review of Systems Review of Systems  Constitutional: Negative for fatigue  and fever.  HENT: Negative for mouth sores.   Eyes: Negative for visual disturbance.  Respiratory: Negative for shortness of breath.   Cardiovascular: Negative for chest pain.  Gastrointestinal: Negative for abdominal pain, nausea and vomiting.  Genitourinary: Negative for genital sores.  Musculoskeletal: Negative for arthralgias and joint swelling.  Skin: Positive for color change and rash. Negative for wound.  Neurological: Negative for dizziness, weakness, light-headedness and headaches.     Physical Exam Triage Vital Signs ED Triage Vitals  Enc Vitals Group     BP 07/21/18 1145 131/78     Pulse Rate 07/21/18 1145 74     Resp 07/21/18 1145 18     Temp 07/21/18 1145 98.6 F (37 C)     Temp Source 07/21/18 1145 Oral     SpO2 07/21/18 1145 99 %     Weight --      Height --      Head Circumference --      Peak Flow --      Pain Score 07/21/18 1146 0     Pain Loc --      Pain Edu? --      Excl. in GC? --    No data found.  Updated Vital Signs BP 131/78 (BP Location: Right Arm)   Pulse 74   Temp  98.6 F (37 C) (Oral)   Resp 18   SpO2 99%   Visual Acuity Right Eye Distance:   Left Eye Distance:   Bilateral Distance:    Right Eye Near:   Left Eye Near:    Bilateral Near:     Physical Exam Vitals signs and nursing note reviewed.  Constitutional:      General: She is not in acute distress.    Appearance: She is well-developed.  HENT:     Head: Normocephalic and atraumatic.     Mouth/Throat:     Comments: Posterior pharynx patent, no lesions on oral mucosa noted Eyes:     Conjunctiva/sclera: Conjunctivae normal.  Neck:     Musculoskeletal: Neck supple.  Cardiovascular:     Rate and Rhythm: Normal rate and regular rhythm.     Heart sounds: No murmur.  Pulmonary:     Effort: Pulmonary effort is normal. No respiratory distress.     Breath sounds: Normal breath sounds.     Comments: Breathing comfortably at rest, CTABL, no wheezing, rales or other adventitious sounds auscultated Abdominal:     Palpations: Abdomen is soft.     Tenderness: There is no abdominal tenderness.  Skin:    General: Skin is warm and dry.     Comments: Bilateral upper and lower extremities with erythematous papules to thighs forearms as well as around neck circumferentially, small area of faint lesions noted to left palmar thenar eminence  Neurological:     Mental Status: She is alert.      UC Treatments / Results  Labs (all labs ordered are listed, but only abnormal results are displayed) Labs Reviewed - No data to display  EKG None  Radiology No results found.  Procedures Procedures (including critical care time)  Medications Ordered in UC Medications - No data to display  Initial Impression / Assessment and Plan / UC Course  I have reviewed the triage vital signs and the nursing notes.  Pertinent labs & imaging results that were available during my care of the patient were reviewed by me and considered in my medical decision making (see chart for details).  Rash with  3-day onset, no hives, not suggestive of underlying vasculitis, possible allergic reaction versus heat rash.  Minimal itching, do not suspect scabies.  Vital signs stable, no systemic symptoms.  Patient has allergy to steroids.  Will recommend antihistamines and close monitoring at this time.  Daily Zyrtec, may supplement Benadryl.  Monitor for self resolution, follow-up if symptoms progressing or worsening, developing systemic symptoms. Final Clinical Impressions(s) / UC Diagnoses   Final diagnoses:  Rash and nonspecific skin eruption     Discharge Instructions     Daily cetirizine Benadryl to supplement at night time  Monitor for rash to resolve over next 4-5 days  Follow up if rash worsening, developing symptoms of fever, shortness of breath, nausea, vomiting, involving mouth   ED Prescriptions    Medication Sig Dispense Auth. Provider   Cetirizine HCl 10 MG CAPS Take 1 capsule (10 mg total) by mouth daily for 10 days. 10 capsule Wieters, Hallie C, PA-C     Controlled Substance Prescriptions [place] Controlled Substance Registry consulted? Not Applicable   Lew DawesWieters, Hallie C, PA-C 07/21/18 [address]1216    Wieters, MarcelineHallie C, [place] JerseyPA-C 07/21/18 1217

## 2018-10-17 ENCOUNTER — Telehealth: Payer: Self-pay | Admitting: Certified Nurse Midwife

## 2018-10-17 NOTE — Telephone Encounter (Signed)
Patient sent the following message through [employer]. Routing to triage to assist patient with request.  Christoper Fabian! I hope you and your family are doing well. As you know I got replacement nexplanon last August. Similar to my first one I haven't had any bleeding or periods. However, I started new workout program 5 weeks ago to help me lose the last 40lbs! I started spotting here and there, no big deal. Well, I started a period on Monday and enough flowing for a tampon and pad every few hours. When should I be concerned of the bleeding doesn't stop or is there something I can take to help it stop?     Thank you,    Ashley Barnes

## 2018-10-17 NOTE — Telephone Encounter (Signed)
Spoke with patient. Nexplanon was exchanged 1 year ago, has not had menses with nexplanon in the past. Started intermittent spotting about 5 weeks ago, full flow started on 8/10. Changing tampon q4-6hrs. Mild menses like cramps. Has not been SA in over 1 yr. Denies N/V, fever/chills, vaginal odor or abnormal d/c. S/p gastric sleeve 05/2017, has lost 100lbs.   Advised patient to continue to monitor, if irregular bleeding continues, OV recommended for further evaluation. If bleeding becomes heavy or new symptoms develop OV needed for further evaluation. Will review with Melvia Heaps, CNM and return call if any additional recommendations. Patient agreeable.   Melvia Heaps, CNM -please review and advise.

## 2018-10-17 NOTE — Telephone Encounter (Signed)
Due to weight loss , she may experience cycles off on as hormones adjust alsoand with Nexplanon. Does not sound excessive. Agree with plan

## 2018-10-18 NOTE — Telephone Encounter (Signed)
Call returned to patient, advised as seen below per Deborah Leonard, CNM. Patient verbalizes understanding and is agreeable. Encounter closed.  

## 2019-04-26 ENCOUNTER — Telehealth: Payer: Self-pay | Admitting: Certified Nurse Midwife

## 2019-04-26 DIAGNOSIS — Z3046 Encounter for surveillance of implantable subdermal contraceptive: Secondary | ICD-10-CM

## 2019-04-26 NOTE — Telephone Encounter (Signed)
Ashley Barnes Clinical Pool  Phone Number: [phone]  Hi,   I have an appointment on 3/3, and I was wondering if we could remove my implant? I have been getting my period every 16-21 days for 4-5 days at time since July. I would prefer to have it removed and at this point probably just do regular birth control.   See you soon!

## 2019-04-29 NOTE — Telephone Encounter (Signed)
Leota Sauers, CNM - please advise on nexplanon removal during AEX on 05/08/19, ok to proceed?

## 2019-04-29 NOTE — Telephone Encounter (Signed)
Order placed for nexplanon removal.   Message to business office for review of benefits.

## 2019-04-29 NOTE — Telephone Encounter (Signed)
Can we do this procedure with aex and bill for both. If not needs to be separately. Also need time to remove if with aex.

## 2019-05-01 NOTE — Telephone Encounter (Signed)
Reviewed with business office, confirmed ok to schedule AEX and removal same appt.   Call returned to patient, advised as seen above. AEX & nexplanon removal rescheduled to 3/8 at 4pm.   Patient is requesting to come in prior to appt for labs if fasting labs are needed. Advised I will review with Leota Sauers, CNM and return call. Patient is agreeable.   Leota Sauers, CNM -please advise on lab for AEX prior to appt.   Cc: Soundra Pilon

## 2019-05-01 NOTE — Telephone Encounter (Signed)
Will need lipid panel, CMP, CBC, TSH, Vitamin D and STD serum if she desires

## 2019-05-02 NOTE — Telephone Encounter (Signed)
Call to patient, left detailed message, ok per dpr. Advised of labs recommended per Leota Sauers, CNM. Return call to office to schedule lab appt and advise if you desire STD testing.

## 2019-05-02 NOTE — Telephone Encounter (Signed)
Call placed to convey benefits for Nexplanon removal. Spoke with the patient and conveyed the benefits. Patient understands/agreeable with the benefits. Patient is aware of the cancellation policy. Appointment scheduled 05/13/19.

## 2019-05-08 ENCOUNTER — Ambulatory Visit: Payer: BLUE CROSS/BLUE SHIELD | Admitting: Certified Nurse Midwife

## 2019-05-08 NOTE — Telephone Encounter (Signed)
Patient has not returned call to date to schedule labs prior to AEX, per patient request. Detailed message left on 05/02/19. Patient is scheduled for AEX and nexplanon removal on 05/13/19.   Routing to [employer], CNM FYI.   Encounter closed.

## 2019-05-13 ENCOUNTER — Other Ambulatory Visit (HOSPITAL_COMMUNITY)
Admission: RE | Admit: 2019-05-13 | Discharge: 2019-05-13 | Disposition: A | Discharge status: Home / Self Care | Payer: BC Managed Care – PPO | Source: Ambulatory Visit | Attending: Certified Nurse Midwife | Admitting: Certified Nurse Midwife

## 2019-05-13 ENCOUNTER — Encounter: Payer: Self-pay | Admitting: Certified Nurse Midwife

## 2019-05-13 ENCOUNTER — Ambulatory Visit (INDEPENDENT_AMBULATORY_CARE_PROVIDER_SITE_OTHER): Payer: BC Managed Care – PPO | Admitting: Certified Nurse Midwife

## 2019-05-13 ENCOUNTER — Other Ambulatory Visit: Payer: Self-pay

## 2019-05-13 VITALS — BP 120/76 | HR 68 | Temp 97.7°F | Resp 16 | Ht 60.5 in | Wt 174.0 lb

## 2019-05-13 DIAGNOSIS — Z01419 Encounter for gynecological examination (general) (routine) without abnormal findings: Secondary | ICD-10-CM | POA: Diagnosis not present

## 2019-05-13 DIAGNOSIS — Z124 Encounter for screening for malignant neoplasm of cervix: Secondary | ICD-10-CM

## 2019-05-13 DIAGNOSIS — Z3046 Encounter for surveillance of implantable subdermal contraceptive: Secondary | ICD-10-CM

## 2019-05-13 NOTE — Progress Notes (Signed)
34 y.o. G1P1001 Single  Hispanic Fe here for annual exam. Continues weight loss after surgery. Ready for Nexplanon to be taken out today.  Periods every 15 -21 days, lasting 4-5 days. No cramping. Ready to have regular periods and feels this will occur with removal of implant.. Does not plan to sexually active. Sees GI only if needed now. Not sexually active since surgery. No other health issues today.    No LMP recorded. Patient has had an implant.          Sexually active: No.  The current method of family planning is nexplanon.    Exercising: Yes.    HIIT & training Smoker:  no  Review of Systems  Constitutional: Negative.   HENT: Negative.   Eyes: Negative.   Respiratory: Negative.   Cardiovascular: Negative.   Gastrointestinal: Negative.   Genitourinary: Negative.   Musculoskeletal: Negative.   Skin: Negative.   Neurological: Negative.   Endo/Heme/Allergies: Negative.   Psychiatric/Behavioral: Negative.     Health Maintenance: Pap: 03-25-15 neg HPV HR neg, 04-06-16 neg, 04-13-17 neg HPV HR neg History of Abnormal Pap: yes MMG:  none Self Breast exams: yes Colonoscopy:  none BMD:   none TDaP:  2016 Shingles: no Pneumonia: no Hep C and HIV: HIV neg 2016 Labs: no   reports that she has never smoked. She has never used smokeless tobacco. She reports that she does not drink alcohol or use drugs.  Past Medical History:  Diagnosis Date  . Abnormal Pap smear    12-29-09 ASCUS+HPV, 01-04-12 LSIL, 01-10-13 ASCUS +HPV  . Allergy   . Anemia   . Asthma   . Blood type O+    on donor card from [facility]  . Migraines    no aura  . Obesity   . Recurrent sinusitis     Past Surgical History:  Procedure Laterality Date  . 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 LAPAROSCOPIC GASTRIC SLEEVE RESECTION    . nexplanon      inserted 11-20-14, 10-06-17 removed & new one inserted  . WISDOM TOOTH EXTRACTION      Current Outpatient Medications  Medication Sig Dispense  Refill  . Cetirizine HCl 10 MG CAPS Take 1 capsule (10 mg total) by mouth daily for 10 days. 10 capsule 0  . Cholecalciferol (VITAMIN D PO) Take by mouth.    . etonogestrel (NEXPLANON) 68 MG IMPL implant 1 each by Subdermal route once.    . Multiple Vitamins-Minerals (MULTIVITAMIN PO) Take by mouth daily.     No current facility-administered medications for this visit.    Family History  Problem Relation Age of Onset  . Diabetes Mother 81  . Hypertension Mother   . Heart disease Father 14       AMI x 3  . Hypertension Father   . Cancer Paternal Grandfather        colon    ROS:  Pertinent items are noted in HPI.  Otherwise, a comprehensive ROS was negative.  Exam:   There were no vitals taken for this visit.   Ht Readings from Last 3 Encounters:  05/03/18 5' 0.75" (1.543 m)  11/09/17 5\' 1"  (1.549 m)  10/06/17 5\' 1"  (1.549 m)    General appearance: alert, cooperative and appears stated age Head: Normocephalic, without obvious abnormality, atraumatic Neck: no adenopathy, supple, symmetrical, trachea midline and thyroid normal to inspection and palpation Lungs: clear to auscultation bilaterally Breasts: normal appearance, no masses  or tenderness, No nipple retraction or dimpling, No nipple discharge or bleeding, No axillary or supraclavicular adenopathy, weight loss skin appearance Heart: regular rate and rhythm Abdomen: soft, non-tender; no masses,  no organomegaly Extremities: extremities normal, atraumatic, no cyanosis or edema Skin: Skin color, texture, turgor normal. No rashes or lesions Lymph nodes: Cervical, supraclavicular, and axillary nodes normal. No abnormal inguinal nodes palpated Neurologic: Grossly normal   Pelvic: External genitalia:  no lesions,normal female              Urethra:  normal appearing urethra with no masses, tenderness or lesions              Bartholin's and Skene's: normal                 Vagina: normal appearing vagina with normal color and  discharge, no lesions              Cervix: multiparous appearance, no cervical motion tenderness, no lesions and period present              Pap taken: Yes.   Bimanual Exam:  Uterus:  normal size, contour, position, consistency, mobility, non-tender              Adnexa: normal adnexa and no mass, fullness, tenderness               Rectovaginal: Confirms               Anus:  normal sphincter tone, no lesions  Chaperone present: yes  A:  Well Woman with normal exam  Contraception Nexplanon with removal planned today  History of weight loss surgery with 84 pounds now  DUB with Nexplanon and probable with weight loss surgery, also  P:   Reviewed health and wellness pertinent to exam  Discussed risks/benefts of Nexplanon, desires removal. Discussed can be put on OCP if needed if cycle does not regulate. She will keep menses calendar and advise. Warning signs with bleeding given.  Continue follow up with Bariatric clinic.  Pap smear: yes   counseled on breast self exam, STD prevention, HIV risk factors and prevention, family planning choices, adequate intake of calcium and vitamin D, diet and exercise.    Nexplanon Removal   LMP:  05/11/2019   After all questions were answered, consent was obtained.     Procedure: Patient placed supine on exam table with her left arm flexed at the elbow and externally rotated. The insertion site was identified on the inner side of upper arm.  The device was palpated.  Incision site for removal was noted.  The removal site was cleansed with betadine solution and 1 ml of 1% Lidocaine was injected at the incision site beneath the tip of the Nexplanon rod. A small 2 mm incision was made at the distal end of the implant.  The Nexplanon implant was grasped with curved forceps and the capsule opened with #11 blade.  Then the Nexplanon device could be removed gently and intact.  The implant was shown to the patient and discarded.  Pressure was applied for excellent  hemostasis.  The incision was closed with a steri strip.  No active bleeding was noted.  A pressure bandage was place over the site.  Assessment: Nexplanon removal  Plan  Pt knows to call back with any questions or concerns.  She is aware of signs/symptoms to watch for over the next few days for infection.    return annually or prn  An After  Visit Summary was printed and given to the patient.

## 2019-05-13 NOTE — Patient Instructions (Addendum)

## 2019-05-15 LAB — CYTOLOGY - PAP
Comment: NEGATIVE
Diagnosis: NEGATIVE
High risk HPV: NEGATIVE

## 2019-05-24 ENCOUNTER — Encounter: Payer: Self-pay | Admitting: Certified Nurse Midwife

## 2019-11-29 ENCOUNTER — Other Ambulatory Visit: Payer: BC Managed Care – PPO

## 2019-11-29 DIAGNOSIS — Z20822 Contact with and (suspected) exposure to covid-19: Secondary | ICD-10-CM

## 2019-11-30 LAB — NOVEL CORONAVIRUS, NAA: SARS-CoV-2, NAA: NOT DETECTED

## 2019-11-30 LAB — SARS-COV-2, NAA 2 DAY TAT

## 2020-06-18 DIAGNOSIS — Z20822 Contact with and (suspected) exposure to covid-19: Secondary | ICD-10-CM | POA: Diagnosis not present

## 2020-11-13 ENCOUNTER — Other Ambulatory Visit: Payer: Self-pay

## 2020-11-13 ENCOUNTER — Ambulatory Visit: Payer: BC Managed Care – PPO | Admitting: Podiatry

## 2020-11-13 ENCOUNTER — Ambulatory Visit (INDEPENDENT_AMBULATORY_CARE_PROVIDER_SITE_OTHER): Payer: BC Managed Care – PPO

## 2020-11-13 DIAGNOSIS — M21612 Bunion of left foot: Secondary | ICD-10-CM | POA: Diagnosis not present

## 2020-11-13 DIAGNOSIS — M21611 Bunion of right foot: Secondary | ICD-10-CM

## 2020-11-13 DIAGNOSIS — M21619 Bunion of unspecified foot: Secondary | ICD-10-CM

## 2020-11-13 NOTE — Progress Notes (Addendum)
Subjective:   Patient ID: Ashley Barnes, female   DOB: 35 y.o.   MRN: [id number]   HPI Patient presents with severe bunion deformity left over right that she states she has had for years and years.  States it is gotten worse over that time increasingly hard to wear shoe gear and that she is tried wider shoes she is tried soaks and anti-inflammatories and is motivated to have these fixed.  Does have family history through her father's family and does not smoke likes to be active   Review of Systems  All other systems reviewed and are negative.      Objective:  Physical Exam Vitals and nursing note reviewed.  Constitutional:      Appearance: She is well-developed.  Pulmonary:     Effort: Pulmonary effort is normal.  Musculoskeletal:        General: Normal range of motion.  Skin:    General: Skin is warm.  Neurological:     Mental Status: She is alert.    Neurovascular status intact muscle strength adequate range of motion within normal limits.  Patient is found to have severe bunion deformity left over right with redness and pain around the first metatarsal head and lesion fifth digit bilateral with keratotic painful lesion noted on the fifth toe.  Patient is found to have good digital perfusion well oriented x3 sit     Assessment:  Here at structural bunion deformity left over right with pain and failure to respond conservatively with hammertoe deformity fifth bilateral     Plan:  H&P reviewed condition and at this point I have recommended surgical intervention.  I recommended Lapidus fusion due to the extreme deformity left with probable Lapidus right with possible distal osteotomy.  I consulted with Dr. Allena Katz who is in agreement and I am going to refer this patient to him for the Lapidus fusion and arthroplasty for the fifth digit.  Patient was seen also by him and reviewed  X-rays indicate significant elevation of the intermetatarsal angle left over right approximate 18 degrees  left 16 degrees right rotated fifth digit bilateral    Addendum made by Dr. Nicholes Rough  I discussed my surgical plan with the patient in extensive detail we will primarily work on the left side prior to the right side.  I discussed with her that she will benefit from Lapidus fusion with correction of fifth digit arthroplasty.  She will be nonweightbearing for 3 weeks followed by weightbearing as tolerated.  I discussed my preoperative intraoperative and postsurgical plan in extensive detail.  She states understand would like to proceed with the surgery. -Informed surgical risk consent was reviewed and read aloud to the patient.  I reviewed the films.  I have discussed my findings with the patient in great detail.  I have discussed all risks including but not limited to infection, stiffness, scarring, limp, disability, deformity, damage to blood vessels and nerves, numbness, poor healing, need for braces, arthritis, chronic pain, amputation, death.  All benefits and realistic expectations discussed in great detail.  I have made no promises as to the outcome.  I have provided realistic expectations.  I have offered the patient a 2nd opinion, which they have declined and assured me they preferred to proceed despite the risks  Nicholes Rough D.P.M.

## 2021-02-19 ENCOUNTER — Telehealth: Payer: Self-pay | Admitting: Podiatry

## 2021-02-19 NOTE — Telephone Encounter (Signed)
DOS: 03/15/2021  [facility] Osteotomy Left - 28310 Lapidus Procedure Including Bunionectomy Left - [address] [facility] Repair 5th Left - [phone]

## 2021-02-22 ENCOUNTER — Telehealth: Payer: Self-pay | Admitting: Urology

## 2021-02-22 NOTE — Telephone Encounter (Signed)
DOS - 03/15/21  [facility] OSTEOTOMY LEFT ---- 82707 LAPIDUS PROC INCLUDING BUNIONECTOMY LEFT --- [address]754 HAMMERTOE REPAIR 5TH LEFT --- 49201    [employer] EFFECTIVE DATE - 03/07/14   SPOKE WITH CARMON WITH [employer] AND SHE STATED THAT FOR CPT CODES 00712, 19758 AND  [phone] NO PRIOR AUTH IS REQUIRED.  REF # [id number]

## 2021-03-15 ENCOUNTER — Encounter: Payer: Self-pay | Admitting: Podiatry

## 2021-03-15 ENCOUNTER — Other Ambulatory Visit: Payer: Self-pay | Admitting: Podiatry

## 2021-03-15 DIAGNOSIS — M2042 Other hammer toe(s) (acquired), left foot: Secondary | ICD-10-CM | POA: Diagnosis not present

## 2021-03-15 DIAGNOSIS — M2012 Hallux valgus (acquired), left foot: Secondary | ICD-10-CM | POA: Diagnosis not present

## 2021-03-15 HISTORY — PX: BUNIONECTOMY: SHX129

## 2021-03-15 MED ORDER — IBUPROFEN 800 MG PO TABS: 800.0000 mg | ORAL_TABLET | Freq: Four times a day (QID) | ORAL | 1 refills | Status: DC | PRN

## 2021-03-15 MED ORDER — OXYCODONE-ACETAMINOPHEN 5-325 MG PO TABS: 1.0000 | ORAL_TABLET | ORAL | 0 refills | Status: DC | PRN

## 2021-03-16 ENCOUNTER — Telehealth: Payer: Self-pay | Admitting: *Deleted

## 2021-03-16 MED ORDER — ONDANSETRON HCL 4 MG PO TABS: 4.0000 mg | ORAL_TABLET | Freq: Three times a day (TID) | ORAL | 0 refills | Status: DC | PRN

## 2021-03-16 NOTE — Telephone Encounter (Signed)
Patient had surgery 1 day ago, nausea patch applied, now she is has thrown up twice today,requesting anti nausea medicine and permission to remove the patch. Please advise.

## 2021-03-16 NOTE — Telephone Encounter (Signed)
Can she remove the nausea patch?

## 2021-03-18 ENCOUNTER — Encounter: Payer: Self-pay | Admitting: Podiatry

## 2021-03-18 NOTE — Telephone Encounter (Signed)
Patient has been notified of removing the patch per Dr. Allena Katz

## 2021-03-24 ENCOUNTER — Other Ambulatory Visit: Payer: Self-pay

## 2021-03-24 ENCOUNTER — Ambulatory Visit (INDEPENDENT_AMBULATORY_CARE_PROVIDER_SITE_OTHER): Payer: BC Managed Care – PPO | Admitting: Podiatry

## 2021-03-24 ENCOUNTER — Ambulatory Visit (INDEPENDENT_AMBULATORY_CARE_PROVIDER_SITE_OTHER): Payer: BC Managed Care – PPO

## 2021-03-24 DIAGNOSIS — M21619 Bunion of unspecified foot: Secondary | ICD-10-CM

## 2021-03-24 DIAGNOSIS — Z9889 Other specified postprocedural states: Secondary | ICD-10-CM

## 2021-03-24 NOTE — Progress Notes (Signed)
°  Subjective:  Patient ID: Ashley Barnes, female    DOB: 1985/03/13,  MRN: [id number]  Chief Complaint  Patient presents with   Routine Post Op     POV #1 DOS 03/15/2021 LT LAPIDUS BUNIONECTOMY Ancil Boozer OSTEOTOMY W/5TH DIGIT HAMMERTOE CORRECTION    DOS: 03/15/2021 Procedure: Left bunion ectomy with Lapidus/Lapa plasty and fifth digit hammertoe correction  36 y.o. female returns for post-op check.  Patient states she is doing good.  Minimal pain.  She is happy with the straight toe.  She has not seen anyone else prior to seeing me.  She denies any other acute complaints  Review of Systems: Negative except as noted in the HPI. Denies N/V/F/Ch.  Past Medical History:  Diagnosis Date   Abnormal Pap smear    12-29-09 ASCUS+HPV, 01-04-12 LSIL, 01-10-13 ASCUS +HPV   Allergy    Anemia    Asthma    Blood type O+    on donor card from [facility]   Migraines    no aura   Obesity    Recurrent sinusitis     Current Outpatient Medications:    Calcium Citrate-Vitamin D (CALCIUM + D PO), Take by mouth., Disp: , Rfl:    ibuprofen (ADVIL) 800 MG tablet, Take 1 tablet (800 mg total) by mouth every 6 (six) hours as needed., Disp: 60 tablet, Rfl: 1   Multiple Vitamins-Minerals (MULTIVITAMIN PO), Take by mouth daily., Disp: , Rfl:    ondansetron (ZOFRAN) 4 MG tablet, Take 1 tablet (4 mg total) by mouth every 8 (eight) hours as needed for nausea or vomiting., Disp: 20 tablet, Rfl: 0   oxyCODONE-acetaminophen (PERCOCET) 5-325 MG tablet, Take 1 tablet by mouth every 4 (four) hours as needed for severe pain., Disp: 30 tablet, Rfl: 0  Social History   Tobacco Use  Smoking Status Never  Smokeless Tobacco Never    Allergies  Allergen Reactions   Corticosteroids Anaphylaxis and Hives   Penicillins Anaphylaxis   Ursodiol Hives   Zocor [Simvastatin - High Dose] Hives and Rash   Objective:  There were no vitals filed for this visit. There is no height or weight on file to calculate  BMI. Constitutional Well developed. Well nourished.  Vascular Foot warm and well perfused. Capillary refill normal to all digits.   Neurologic Normal speech. Oriented to person, place, and time. Epicritic sensation to light touch grossly present bilaterally.  Dermatologic Skin healing well without signs of infection. Skin edges well coapted without signs of infection.  Orthopedic: Tenderness to palpation noted about the surgical site.   Radiographs: 3 views of skeletally mature the left foot: Good correction alignment noted.  Reduction of sesamoid is noted.  Clinically the toe is sitting up straighter.  No failure of hardware noted Assessment:   1. Bunion   2. Status post foot surgery    Plan:  Patient was evaluated and treated and all questions answered.  S/p foot surgery left -Progressing as expected post-operatively. -XR: See above -WB Status: Nonweightbearing in knee scooter -Sutures: Intact.  No clinical signs of dehiscence noted.  No complication noted. -Medications: None -Foot redressed.  No follow-ups on file.

## 2021-04-07 ENCOUNTER — Ambulatory Visit (INDEPENDENT_AMBULATORY_CARE_PROVIDER_SITE_OTHER): Payer: BC Managed Care – PPO | Admitting: Podiatry

## 2021-04-07 ENCOUNTER — Other Ambulatory Visit: Payer: Self-pay

## 2021-04-07 ENCOUNTER — Encounter: Payer: BC Managed Care – PPO | Admitting: Podiatry

## 2021-04-07 DIAGNOSIS — Z01818 Encounter for other preprocedural examination: Secondary | ICD-10-CM

## 2021-04-07 DIAGNOSIS — M2011 Hallux valgus (acquired), right foot: Secondary | ICD-10-CM

## 2021-04-07 DIAGNOSIS — Z9889 Other specified postprocedural states: Secondary | ICD-10-CM

## 2021-04-07 NOTE — Progress Notes (Addendum)
\  Subjective:  Patient ID: Ashley Barnes, female    DOB: [birth year],  MRN: [id number]  Chief Complaint  Patient presents with   Routine Post Op    POV #2 DOS 03/15/2021 LT LAPIDUS BUNIONECTOMY Donnella Bi OSTEOTOMY W/5TH DIGIT HAMMERTOE CORRECTION    DOS: 03/15/2021 Procedure: Left bunion ectomy with Lapidus/Lapa plasty and fifth digit hammertoe correction  36 y.o. female returns for post-op check.  Patient states she is doing good.  Minimal pain.  She is happy with the straight toe.  She states she started walking with the boot on earlier this week.  She is doing pretty good.  She is here to have her taken stitches taken out.  She would also like to discuss scheduling the right side surgery as the right side is causing her some pain as well.  Review of Systems: Negative except as noted in the HPI. Denies N/V/F/Ch.  Past Medical History:  Diagnosis Date   Abnormal Pap smear    12-29-09 ASCUS+HPV, 01-04-12 LSIL, 01-10-13 ASCUS +HPV   Allergy    Anemia    Asthma    Blood type O+    on donor card from [facility]   Migraines    no aura   Obesity    Recurrent sinusitis     Current Outpatient Medications:    Calcium Citrate-Vitamin D (CALCIUM + D PO), Take by mouth., Disp: , Rfl:    ibuprofen (ADVIL) 800 MG tablet, Take 1 tablet (800 mg total) by mouth every 6 (six) hours as needed., Disp: 60 tablet, Rfl: 1   Multiple Vitamins-Minerals (MULTIVITAMIN PO), Take by mouth daily., Disp: , Rfl:    ondansetron (ZOFRAN) 4 MG tablet, Take 1 tablet (4 mg total) by mouth every 8 (eight) hours as needed for nausea or vomiting., Disp: 20 tablet, Rfl: 0   oxyCODONE-acetaminophen (PERCOCET) 5-325 MG tablet, Take 1 tablet by mouth every 4 (four) hours as needed for severe pain., Disp: 30 tablet, Rfl: 0  Social History   Tobacco Use  Smoking Status Never  Smokeless Tobacco Never    Allergies  Allergen Reactions   Corticosteroids Anaphylaxis and Hives   Penicillins Anaphylaxis   Ursodiol Hives    Zocor [Simvastatin - High Dose] Hives and Rash   Objective:  There were no vitals filed for this visit. There is no height or weight on file to calculate BMI. Constitutional Well developed. Well nourished.  Vascular Foot warm and well perfused. Capillary refill normal to all digits.   Neurologic Normal speech. Oriented to person, place, and time. Epicritic sensation to light touch grossly present bilaterally.  Dermatologic No clinical signs of Deis is noted.  No complication noted.  Good correction alignment noted.  Orthopedic: Mild tenderness to palpation noted about the surgical site.  Right bunion deformity noted with track bound not a tracking deformity.  Pain on palpation to the medial eminence.  Pain at the fifth digit semiflexible in nature hammertoe contracture noted of the fifth digit.  No intra-articular first metatarsophalangeal joint pain noted.   Radiographs: 3 views of skeletally mature the left foot: Good correction alignment noted.  Reduction of sesamoid is noted.  Clinically the toe is sitting up straighter.  No failure of hardware noted  Review of prior radiographs of the right foot show severe bunion deformity with intermetatarsal angle that is severe in nature.  No osteoarthritic changes noted of the first metatarsophalangeal joint.  Sesamoid position of 5 out of 7.  Metatarsal parabola is intact Assessment:  1. Hav (hallux abducto valgus), right   2. Encounter for preoperative examination for general surgical procedure   3. Status post foot surgery     Plan:  Patient was evaluated and treated and all questions answered.  S/p foot surgery left -Progressing as expected post-operatively. -XR: See above -WB Status: Weightbearing as tolerated in cam boot -Sutures: Removed no clinical signs of dehiscence noted.  No complication noted. -Medications: None   Right bunion deformity with underlying fifth digit hammertoe -All questions and concerns were discussed  with the patient given that the right side is also continuously hurting her I believe she will benefit from a surgical Lapiplasty procedure from the right side.  I discussed my preoperative intraoperative and postoperative plan with the patient in extensive detail.  I will address the bunion which is severe in nature as well as the fifth digit hammertoe contracture.  The patient states understanding would like to proceed with surgery. -She will be nonweightbearing to the right lower extremity and knee scooter =Informed surgical risk consent was reviewed and read aloud to the patient.  I reviewed the films.  I have discussed my findings with the patient in great detail.  I have discussed all risks including but not limited to infection, stiffness, scarring, limp, disability, deformity, damage to blood vessels and nerves, numbness, poor healing, need for braces, arthritis, chronic pain, amputation, death.  All benefits and realistic expectations discussed in great detail.  I have made no promises as to the outcome.  I have provided realistic expectations.  I have offered the patient a 2nd opinion, which they have declined and assured me they preferred to proceed despite the risks  No follow-ups on file.

## 2021-04-21 ENCOUNTER — Encounter: Payer: BC Managed Care – PPO | Admitting: Podiatry

## 2021-05-07 ENCOUNTER — Other Ambulatory Visit: Payer: Self-pay

## 2021-05-07 ENCOUNTER — Ambulatory Visit (INDEPENDENT_AMBULATORY_CARE_PROVIDER_SITE_OTHER): Payer: BC Managed Care – PPO | Admitting: Podiatry

## 2021-05-07 ENCOUNTER — Ambulatory Visit (INDEPENDENT_AMBULATORY_CARE_PROVIDER_SITE_OTHER): Payer: BC Managed Care – PPO

## 2021-05-07 DIAGNOSIS — Z9889 Other specified postprocedural states: Secondary | ICD-10-CM

## 2021-05-07 DIAGNOSIS — M2011 Hallux valgus (acquired), right foot: Secondary | ICD-10-CM

## 2021-05-07 NOTE — Progress Notes (Signed)
\  ?Subjective:  ?Patient ID: Ashley Barnes, female    DOB: [birth year],  MRN: [id number] ? ?Chief Complaint  ?Patient presents with  ? Routine Post Op  ?   POV #3 DOS 03/15/2021 LT LAPIDUS BUNIONECTOMY Donnella Bi OSTEOTOMY W/5TH DIGIT HAMMERTOE CORRECTION 4 WK F/U  ? ? ?DOS: 03/15/2021 ?Procedure: Left bunion ectomy with Lapidus/Lapa plasty and fifth digit hammertoe correction ? ?36 y.o. female returns for post-op check.  Patient states she is doing great from the surgical site.  She is scheduled for the left side surgery as well.  The right side has a superficial dehiscence.  I have asked her to apply Betadine wet-to-dry. ?Review of Systems: Negative except as noted in the HPI. Denies N/V/F/Ch. ? ?Past Medical History:  ?Diagnosis Date  ? Abnormal Pap smear   ? 12-29-09 ASCUS+HPV, 01-04-12 LSIL, 01-10-13 ASCUS +HPV  ? Allergy   ? Anemia   ? Asthma   ? Blood type O+   ? on donor card from [facility]  ? Migraines   ? no aura  ? Obesity   ? Recurrent sinusitis   ? ? ?Current Outpatient Medications:  ?  Calcium Citrate-Vitamin D (CALCIUM + D PO), Take by mouth., Disp: , Rfl:  ?  ibuprofen (ADVIL) 800 MG tablet, Take 1 tablet (800 mg total) by mouth every 6 (six) hours as needed., Disp: 60 tablet, Rfl: 1 ?  Multiple Vitamins-Minerals (MULTIVITAMIN PO), Take by mouth daily., Disp: , Rfl:  ?  ondansetron (ZOFRAN) 4 MG tablet, Take 1 tablet (4 mg total) by mouth every 8 (eight) hours as needed for nausea or vomiting., Disp: 20 tablet, Rfl: 0 ?  oxyCODONE-acetaminophen (PERCOCET) 5-325 MG tablet, Take 1 tablet by mouth every 4 (four) hours as needed for severe pain., Disp: 30 tablet, Rfl: 0 ? ?Social History  ? ?Tobacco Use  ?Smoking Status Never  ?Smokeless Tobacco Never  ? ? ?Allergies  ?Allergen Reactions  ? Corticosteroids Anaphylaxis and Hives  ? Penicillins Anaphylaxis  ? Ursodiol Hives  ? Zocor [Simvastatin - High Dose] Hives and Rash  ? ?Objective:  ?There were no vitals filed for this visit. ?There is no height or  weight on file to calculate BMI. ?Constitutional Well developed. ?Well nourished.  ?Vascular Foot warm and well perfused. ?Capillary refill normal to all digits.   ?Neurologic Normal speech. ?Oriented to person, place, and time. ?Epicritic sensation to light touch grossly present bilaterally.  ?Dermatologic No clinical signs of Deis is noted.  No complication noted.  Good correction alignment noted.  ?Orthopedic: Mild tenderness to palpation noted about the surgical site. ? ?Right bunion deformity noted with track bound not a tracking deformity.  Pain on palpation to the medial eminence.  Pain at the fifth digit semiflexible in nature hammertoe contracture noted of the fifth digit.  No intra-articular first metatarsophalangeal joint pain noted.  ? ?Radiographs: 3 views of skeletally mature the left foot: Good correction alignment noted.  Reduction of sesamoid is noted.  Clinically the toe is sitting up straighter.  No failure of hardware noted ? ?Review of prior radiographs of the right foot show severe bunion deformity with intermetatarsal angle that is severe in nature.  No osteoarthritic changes noted of the first metatarsophalangeal joint.  Sesamoid position of 5 out of 7.  Metatarsal parabola is intact ?Assessment:  ? ?1. Status post foot surgery   ?2. Hav (hallux abducto valgus), right   ? ? ?Plan:  ?Patient was evaluated and treated and all questions answered. ? ?  S/p foot surgery left ?-Progressing as expected post-operatively. ?-XR: See above ?-WB Status: Weightbearing as tolerated in cam boot ?-Sutures: None ?-Medications: None ?-Superficial dehiscence noted approximately measuring 1 cm x 1 cm.  Superficial exposure of hardware noted no redness noted.  Betadine wet-to-dry dressing applied and have asked the patient to do 1 today.  She states understanding ? ? ?Right bunion deformity with underlying fifth digit hammertoe ?-All questions and concerns were discussed with the patient given that the right side  is also continuously hurting her I believe she will benefit from a surgical Lapiplasty procedure from the right side.  I discussed my preoperative intraoperative and postoperative plan with the patient in extensive detail.  I will address the bunion which is severe in nature as well as the fifth digit hammertoe contracture.  The patient states understanding would like to proceed with surgery. ?-She will be nonweightbearing to the right lower extremity and knee scooter ?=Informed surgical risk consent was reviewed and read aloud to the patient.  I reviewed the films.  I have discussed my findings with the patient in great detail.  I have discussed all risks including but not limited to infection, stiffness, scarring, limp, disability, deformity, damage to blood vessels and nerves, numbness, poor healing, need for braces, arthritis, chronic pain, amputation, death.  All benefits and realistic expectations discussed in great detail.  I have made no promises as to the outcome.  I have provided realistic expectations.  I have offered the patient a 2nd opinion, which they have declined and assured me they preferred to proceed despite the risks ? ?No follow-ups on file.  ?

## 2021-05-18 ENCOUNTER — Telehealth: Payer: Self-pay | Admitting: Urology

## 2021-05-18 NOTE — Telephone Encounter (Signed)
DOS - 06/14/21 ? ?LAPIDUS PROCEDURE INCLUDING BUNIONECTOMY RIGHT --- [phone] ?HAMMERTOE REPAIR 5TH RIGHT --- [phone] ? ?[employer] EFFECTIVE DATE - 03/07/14 ? ?PLAN DEDUCTIBLE - $500.00 W/ $0.00 REMAINING ?OUT OF POCKET - $2,000.00 W/ $0.00 REMAINING ?COINSURANCE - 20% ?COPAY - $0.00 ? ? ?SPOKE WITH MARIE C. WITH [employer] AND SHE STATED THAT FOR CPT CODES 60454 AND 09811 NO PRIOR AUTH IS REQUIRED. ? ?REF # I [id number] ?

## 2021-05-26 ENCOUNTER — Ambulatory Visit (INDEPENDENT_AMBULATORY_CARE_PROVIDER_SITE_OTHER): Payer: BC Managed Care – PPO | Admitting: Podiatry

## 2021-05-26 ENCOUNTER — Other Ambulatory Visit: Payer: Self-pay

## 2021-05-26 DIAGNOSIS — Z9889 Other specified postprocedural states: Secondary | ICD-10-CM

## 2021-05-26 DIAGNOSIS — M2011 Hallux valgus (acquired), right foot: Secondary | ICD-10-CM

## 2021-05-26 NOTE — Progress Notes (Signed)
\  ?Subjective:  ?Patient ID: Ashley Barnes, female    DOB: October 06, 1985,  MRN: [id number] ? ?Chief Complaint  ?Patient presents with  ? Routine Post Op  ?  POV #4 DOS 03/15/2021 LT LAPIDUS BUNIONECTOMY Donnella Bi OSTEOTOMY W/5TH DIGIT HAMMERTOE CORRECTION 4 WK F/U  ? ? ?DOS: 03/15/2021 ?Procedure: Left bunion ectomy with Lapidus/Lapa plasty and fifth digit hammertoe correction ? ?36 y.o. female returns for post-op check.  Patient states she is doing great from the surgical site.  She is scheduled for the left side surgery as well.  The right side has completely healed.  She denies any other acute complaints ?Review of Systems: Negative except as noted in the HPI. Denies N/V/F/Ch. ? ?Past Medical History:  ?Diagnosis Date  ? Abnormal Pap smear   ? 12-29-09 ASCUS+HPV, 01-04-12 LSIL, 01-10-13 ASCUS +HPV  ? Allergy   ? Anemia   ? Asthma   ? Blood type O+   ? on donor card from [employer]  ? Migraines   ? no aura  ? Obesity   ? Recurrent sinusitis   ? ? ?Current Outpatient Medications:  ?  Calcium Citrate-Vitamin D (CALCIUM + D PO), Take by mouth., Disp: , Rfl:  ?  ibuprofen (ADVIL) 800 MG tablet, Take 1 tablet (800 mg total) by mouth every 6 (six) hours as needed., Disp: 60 tablet, Rfl: 1 ?  Multiple Vitamins-Minerals (MULTIVITAMIN PO), Take by mouth daily., Disp: , Rfl:  ?  ondansetron (ZOFRAN) 4 MG tablet, Take 1 tablet (4 mg total) by mouth every 8 (eight) hours as needed for nausea or vomiting., Disp: 20 tablet, Rfl: 0 ?  oxyCODONE-acetaminophen (PERCOCET) 5-325 MG tablet, Take 1 tablet by mouth every 4 (four) hours as needed for severe pain., Disp: 30 tablet, Rfl: 0 ? ?Social History  ? ?Tobacco Use  ?Smoking Status Never  ?Smokeless Tobacco Never  ? ? ?Allergies  ?Allergen Reactions  ? Corticosteroids Anaphylaxis and Hives  ? Penicillins Anaphylaxis  ? Ursodiol Hives  ? Zocor [Simvastatin - High Dose] Hives and Rash  ? ?Objective:  ?There were no vitals filed for this visit. ?There is no height or weight on file to  calculate BMI. ?Constitutional Well developed. ?Well nourished.  ?Vascular Foot warm and well perfused. ?Capillary refill normal to all digits.   ?Neurologic Normal speech. ?Oriented to person, place, and time. ?Epicritic sensation to light touch grossly present bilaterally.  ?Dermatologic No clinical signs of Deis is noted.  No complication noted.  Good correction alignment noted.  ?Orthopedic: Mild tenderness to palpation noted about the surgical site. ? ?Right bunion deformity noted with track bound not a tracking deformity.  Pain on palpation to the medial eminence.  Pain at the fifth digit semiflexible in nature hammertoe contracture noted of the fifth digit.  No intra-articular first metatarsophalangeal joint pain noted.  ? ?Radiographs: 3 views of skeletally mature the left foot: Good correction alignment noted.  Reduction of sesamoid is noted.  Clinically the toe is sitting up straighter.  No failure of hardware noted ? ?Review of prior radiographs of the right foot show severe bunion deformity with intermetatarsal angle that is severe in nature.  No osteoarthritic changes noted of the first metatarsophalangeal joint.  Sesamoid position of 5 out of 7.  Metatarsal parabola is intact ?Assessment:  ? ?1. Hav (hallux abducto valgus), right   ?2. Status post foot surgery   ? ? ? ?Plan:  ?Patient was evaluated and treated and all questions answered. ? ?S/p foot  surgery left ?-Clinically healed.  Small superficial scab formation noted.  Patient can resume normal activities.  No restrictions she can start washing her foot again.  At this time patient is officially discharged from my care given the good reduction of bunion deformity.  If any foot and ankle issues arises to the left side she will come back and see me. ? 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  I discussed my preoperative intraoperative and postoperative plan with the patient in extensive detail.  I will address the bunion which is severe in nature as well as the fifth digit hammertoe contracture.  The patient states understanding would like to proceed with surgery. ?-She will be nonweightbearing to the right lower extremity and knee scooter ?=Informed surgical risk consent was reviewed and read aloud to the patient.  I reviewed the films.  I have discussed my findings with the patient in great detail.  I have discussed all risks including but not limited to infection, stiffness, scarring, limp, disability, deformity, damage to blood vessels and nerves, numbness, poor healing, need for braces, arthritis, chronic pain, amputation, death.  All benefits and realistic expectations discussed in great detail.  I have made no promises as to the outcome.  I have provided realistic expectations.  I have offered the patient a 2nd opinion, which they have declined and assured me they preferred to proceed despite the risks ? ?No follow-ups on file.  ?

## 2021-05-28 ENCOUNTER — Encounter: Payer: Self-pay | Admitting: Podiatry

## 2021-06-14 ENCOUNTER — Other Ambulatory Visit: Payer: Self-pay | Admitting: Podiatry

## 2021-06-14 ENCOUNTER — Encounter: Payer: Self-pay | Admitting: Podiatry

## 2021-06-14 DIAGNOSIS — M2011 Hallux valgus (acquired), right foot: Secondary | ICD-10-CM | POA: Diagnosis not present

## 2021-06-14 DIAGNOSIS — M2041 Other hammer toe(s) (acquired), right foot: Secondary | ICD-10-CM | POA: Diagnosis not present

## 2021-06-14 HISTORY — PX: BUNIONECTOMY: SHX129

## 2021-06-14 MED ORDER — OXYCODONE-ACETAMINOPHEN 5-325 MG PO TABS: 1.0000 | ORAL_TABLET | ORAL | 0 refills | Status: DC | PRN

## 2021-06-14 MED ORDER — IBUPROFEN 800 MG PO TABS: 800.0000 mg | ORAL_TABLET | Freq: Four times a day (QID) | ORAL | 1 refills | Status: DC | PRN

## 2021-06-23 ENCOUNTER — Ambulatory Visit (INDEPENDENT_AMBULATORY_CARE_PROVIDER_SITE_OTHER): Payer: BC Managed Care – PPO

## 2021-06-23 ENCOUNTER — Ambulatory Visit (INDEPENDENT_AMBULATORY_CARE_PROVIDER_SITE_OTHER): Payer: BC Managed Care – PPO | Admitting: Podiatry

## 2021-06-23 DIAGNOSIS — M2011 Hallux valgus (acquired), right foot: Secondary | ICD-10-CM | POA: Diagnosis not present

## 2021-06-23 DIAGNOSIS — Z9889 Other specified postprocedural states: Secondary | ICD-10-CM

## 2021-06-23 NOTE — Progress Notes (Signed)
?  Subjective:  ?Patient ID: Ashley Barnes, female    DOB: [birth year],  MRN: [id number] ? ?Chief Complaint  ?Patient presents with  ? Routine Post Op  ?   POV #1 DOS 06/14/21 --- RIGHT FOOT LAPIDUS BUNIONECTOMY WITH 5TH DIGIT HAMMERTOE WITH FIXATION  ? ? ?DOS: 06/14/2021 ?Procedure: Right Lapidus bunionectomy with fifth digit hammertoe fixation ? ?36 y.o. female returns for post-op check.  Patient states that she is doing well.  Minimal pain.  Nonweightbearing to the right lower extremity with the boot on.  No nausea fever chills vomiting bandages clean dry and intact ? ?Review of Systems: Negative except as noted in the HPI. Denies N/V/F/Ch. ? ?Past Medical History:  ?Diagnosis Date  ? Abnormal Pap smear   ? 12-29-09 ASCUS+HPV, 01-04-12 LSIL, 01-10-13 ASCUS +HPV  ? Allergy   ? Anemia   ? Asthma   ? Blood type O+   ? on donor card from [employer]  ? Migraines   ? no aura  ? Obesity   ? Recurrent sinusitis   ? ? ?Current Outpatient Medications:  ?  Calcium Citrate-Vitamin D (CALCIUM + D PO), Take by mouth., Disp: , Rfl:  ?  ibuprofen (ADVIL) 800 MG tablet, Take 1 tablet (800 mg total) by mouth every 6 (six) hours as needed., Disp: 60 tablet, Rfl: 1 ?  ibuprofen (ADVIL) 800 MG tablet, Take 1 tablet (800 mg total) by mouth every 6 (six) hours as needed., Disp: 60 tablet, Rfl: 1 ?  Multiple Vitamins-Minerals (MULTIVITAMIN PO), Take by mouth daily., Disp: , Rfl:  ?  ondansetron (ZOFRAN) 4 MG tablet, Take 1 tablet (4 mg total) by mouth every 8 (eight) hours as needed for nausea or vomiting., Disp: 20 tablet, Rfl: 0 ?  oxyCODONE-acetaminophen (PERCOCET) 5-325 MG tablet, Take 1 tablet by mouth every 4 (four) hours as needed for severe pain., Disp: 30 tablet, Rfl: 0 ?  oxyCODONE-acetaminophen (PERCOCET) 5-325 MG tablet, Take 1 tablet by mouth every 4 (four) hours as needed for severe pain., Disp: 30 tablet, Rfl: 0 ? ?Social History  ? ?Tobacco Use  ?Smoking Status Never  ?Smokeless Tobacco Never  ? ? ?Allergies  ?Allergen  Reactions  ? Corticosteroids Anaphylaxis and Hives  ? Penicillins Anaphylaxis  ? Ursodiol Hives  ? Zocor [Simvastatin - High Dose] Hives and Rash  ? ?Objective:  ?There were no vitals filed for this visit. ?There is no height or weight on file to calculate BMI. ?Constitutional Well developed. ?Well nourished.  ?Vascular Foot warm and well perfused. ?Capillary refill normal to all digits.   ?Neurologic Normal speech. ?Oriented to person, place, and time. ?Epicritic sensation to light touch grossly present bilaterally.  ?Dermatologic Skin healing well without signs of infection. Skin edges well coapted without signs of infection.  ?Orthopedic: Tenderness to palpation noted about the surgical site.  ? ?Radiographs: 3 views of schedule mature the right foot: Good correction alignment noted.  Reduction of bunion deformity noted. ?Assessment:  ? ?1. Hav (hallux abducto valgus), right   ?2. Status post foot surgery   ? ?Plan:  ?Patient was evaluated and treated and all questions answered. ? ?S/p foot surgery right ?-Progressing as expected post-operatively. ?-XR: See above ?-WB Status: Nonweightbearing in right lower extremity and knee scooter ?-Sutures: Intact.  No clinical signs of Deis is noted.  No complication noted. ?-Medications: None ?-Foot redressed. ? ?No follow-ups on file.  ?

## 2021-07-07 ENCOUNTER — Ambulatory Visit (INDEPENDENT_AMBULATORY_CARE_PROVIDER_SITE_OTHER): Payer: BC Managed Care – PPO | Admitting: Podiatry

## 2021-07-07 DIAGNOSIS — Z9889 Other specified postprocedural states: Secondary | ICD-10-CM

## 2021-07-07 DIAGNOSIS — M2011 Hallux valgus (acquired), right foot: Secondary | ICD-10-CM

## 2021-07-07 NOTE — Progress Notes (Signed)
?  Subjective:  ?Patient ID: Ashley Barnes, female    DOB: 05-31-1985,  MRN: [id number] ? ?Chief Complaint  ?Patient presents with  ? Routine Post Op  ?  POV #2 DOS 06/14/21 --- RIGHT FOOT LAPIDUS BUNIONECTOMY WITH 5TH DIGIT HAMMERTOE WITH FIXATION  ? ? ?DOS: 06/14/2021 ?Procedure: Right Lapidus bunionectomy with fifth digit hammertoe fixation ? ?36 y.o. female returns for post-op check.  Patient states that she is doing well.  Minimal pain.  She has been nonweightbearing to the right lower extremity with knee scooter.  She denies any other acute complaints. ? ?Review of Systems: Negative except as noted in the HPI. Denies N/V/F/Ch. ? ?Past Medical History:  ?Diagnosis Date  ? Abnormal Pap smear   ? 12-29-09 ASCUS+HPV, 01-04-12 LSIL, 01-10-13 ASCUS +HPV  ? Allergy   ? Anemia   ? Asthma   ? Blood type O+   ? on donor card from [employer]  ? Migraines   ? no aura  ? Obesity   ? Recurrent sinusitis   ? ? ?Current Outpatient Medications:  ?  Calcium Citrate-Vitamin D (CALCIUM + D PO), Take by mouth., Disp: , Rfl:  ?  ibuprofen (ADVIL) 800 MG tablet, Take 1 tablet (800 mg total) by mouth every 6 (six) hours as needed., Disp: 60 tablet, Rfl: 1 ?  ibuprofen (ADVIL) 800 MG tablet, Take 1 tablet (800 mg total) by mouth every 6 (six) hours as needed., Disp: 60 tablet, Rfl: 1 ?  Multiple Vitamins-Minerals (MULTIVITAMIN PO), Take by mouth daily., Disp: , Rfl:  ?  ondansetron (ZOFRAN) 4 MG tablet, Take 1 tablet (4 mg total) by mouth every 8 (eight) hours as needed for nausea or vomiting., Disp: 20 tablet, Rfl: 0 ?  oxyCODONE-acetaminophen (PERCOCET) 5-325 MG tablet, Take 1 tablet by mouth every 4 (four) hours as needed for severe pain., Disp: 30 tablet, Rfl: 0 ?  oxyCODONE-acetaminophen (PERCOCET) 5-325 MG tablet, Take 1 tablet by mouth every 4 (four) hours as needed for severe pain., Disp: 30 tablet, Rfl: 0 ? ?Social History  ? ?Tobacco Use  ?Smoking Status Never  ?Smokeless Tobacco Never  ? ? ?Allergies  ?Allergen Reactions   ? Corticosteroids Anaphylaxis and Hives  ? Penicillins Anaphylaxis  ? Ursodiol Hives  ? Zocor [Simvastatin - High Dose] Hives and Rash  ? ?Objective:  ?There were no vitals filed for this visit. ?There is no height or weight on file to calculate BMI. ?Constitutional Well developed. ?Well nourished.  ?Vascular Foot warm and well perfused. ?Capillary refill normal to all digits.   ?Neurologic Normal speech. ?Oriented to person, place, and time. ?Epicritic sensation to light touch grossly present bilaterally.  ?Dermatologic Skin completely epithelialized.  Good correction alignment noted.  Good reduction of bunion deformity noted.  ?Orthopedic: No tenderness to palpation noted about the surgical site.  ? ?Radiographs: 3 views of schedule mature the right foot: Good correction alignment noted.  Reduction of bunion deformity noted. ?Assessment:  ? ?1. Hav (hallux abducto valgus), right   ?2. Status post foot surgery   ? ? ?Plan:  ?Patient was evaluated and treated and all questions answered. ? ?S/p foot surgery right ?-Progressing as expected post-operatively. ?-XR: See above ?-WB Status: Slowly begin weightbearing as tolerated in cam boot. ?-Sutures: Removed no clinical signs of Deis is noted.  No complication noted. ?-Medications: None ?-Foot redressed. ? ?No follow-ups on file.  ?

## 2021-08-04 ENCOUNTER — Ambulatory Visit (INDEPENDENT_AMBULATORY_CARE_PROVIDER_SITE_OTHER): Payer: BC Managed Care – PPO

## 2021-08-04 ENCOUNTER — Ambulatory Visit (INDEPENDENT_AMBULATORY_CARE_PROVIDER_SITE_OTHER): Payer: BC Managed Care – PPO | Admitting: Podiatry

## 2021-08-04 DIAGNOSIS — M2011 Hallux valgus (acquired), right foot: Secondary | ICD-10-CM | POA: Diagnosis not present

## 2021-08-04 DIAGNOSIS — Z9889 Other specified postprocedural states: Secondary | ICD-10-CM

## 2021-08-05 NOTE — Progress Notes (Signed)
  Subjective:  Patient ID: Ashley Barnes, female    DOB: 07/07/1985,  MRN: [id number]  No chief complaint on file.   DOS: 06/14/2021 Procedure: Right Lapidus bunionectomy with fifth digit hammertoe fixation  36 y.o. female returns for post-op check.  Patient states that she is doing well.  Minimal pain.  She has been weightbearing as tolerated in regular shoes.  She denies any other acute complaints no pain  Review of Systems: Negative except as noted in the HPI. Denies N/V/F/Ch.  Past Medical History:  Diagnosis Date   Abnormal Pap smear    12-29-09 ASCUS+HPV, 01-04-12 LSIL, 01-10-13 ASCUS +HPV   Allergy    Anemia    Asthma    Blood type O+    on donor card from [facility]   Migraines    no aura   Obesity    Recurrent sinusitis     Current Outpatient Medications:    Calcium Citrate-Vitamin D (CALCIUM + D PO), Take by mouth., Disp: , Rfl:    ibuprofen (ADVIL) 800 MG tablet, Take 1 tablet (800 mg total) by mouth every 6 (six) hours as needed., Disp: 60 tablet, Rfl: 1   ibuprofen (ADVIL) 800 MG tablet, Take 1 tablet (800 mg total) by mouth every 6 (six) hours as needed., Disp: 60 tablet, Rfl: 1   Multiple Vitamins-Minerals (MULTIVITAMIN PO), Take by mouth daily., Disp: , Rfl:    ondansetron (ZOFRAN) 4 MG tablet, Take 1 tablet (4 mg total) by mouth every 8 (eight) hours as needed for nausea or vomiting., Disp: 20 tablet, Rfl: 0   oxyCODONE-acetaminophen (PERCOCET) 5-325 MG tablet, Take 1 tablet by mouth every 4 (four) hours as needed for severe pain., Disp: 30 tablet, Rfl: 0   oxyCODONE-acetaminophen (PERCOCET) 5-325 MG tablet, Take 1 tablet by mouth every 4 (four) hours as needed for severe pain., Disp: 30 tablet, Rfl: 0  Social History   Tobacco Use  Smoking Status Never  Smokeless Tobacco Never    Allergies  Allergen Reactions   Corticosteroids Anaphylaxis and Hives   Penicillins Anaphylaxis   Ursodiol Hives   Zocor [Simvastatin - High Dose] Hives and Rash    Objective:  There were no vitals filed for this visit. There is no height or weight on file to calculate BMI. Constitutional Well developed. Well nourished.  Vascular Foot warm and well perfused. Capillary refill normal to all digits.   Neurologic Normal speech. Oriented to person, place, and time. Epicritic sensation to light touch grossly present bilaterally.  Dermatologic Skin completely epithelialized.  Good correction alignment noted.  Good reduction of bunion deformity noted.  Orthopedic: No tenderness to palpation noted about the surgical site.   Radiographs: 3 views of schedule mature the right foot: Good correction alignment noted.  Reduction of bunion deformity noted. Assessment:   1. Hav (hallux abducto valgus), right     Plan:  Patient was evaluated and treated and all questions answered.  S/p foot surgery right -Clinically healed and is officially discharged from my care if any foot and ankle issues arise in future advised her to come back and see me.  She states understanding.  She is wearing good shoes and is offloading both of her feet she is functioning well with orthotics.  No follow-ups on file.

## 2021-08-27 ENCOUNTER — Encounter (HOSPITAL_COMMUNITY): Payer: Self-pay

## 2021-08-27 ENCOUNTER — Ambulatory Visit (HOSPITAL_COMMUNITY)
Admission: RE | Admit: 2021-08-27 | Discharge: 2021-08-27 | Disposition: A | Discharge status: Home / Self Care | Payer: BC Managed Care – PPO | Source: Ambulatory Visit | Attending: Family Medicine | Admitting: Family Medicine

## 2021-08-27 VITALS — BP 124/81 | HR 78 | Temp 97.8°F | Resp 16

## 2021-08-27 DIAGNOSIS — J020 Streptococcal pharyngitis: Secondary | ICD-10-CM

## 2021-08-27 LAB — POCT RAPID STREP A, ED / UC: Streptococcus, Group A Screen (Direct): POSITIVE — AB

## 2021-08-27 MED ORDER — CLINDAMYCIN HCL 300 MG PO CAPS: 300.0000 mg | ORAL_CAPSULE | Freq: Three times a day (TID) | ORAL | 0 refills | Status: AC

## 2021-08-27 NOTE — ED Triage Notes (Signed)
Pt states sore throat for the past 3 days with fever and chills. Has been taking Zyrtec and Motrin at home with relief.

## 2021-10-13 ENCOUNTER — Ambulatory Visit (INDEPENDENT_AMBULATORY_CARE_PROVIDER_SITE_OTHER): Payer: BC Managed Care – PPO | Admitting: Podiatry

## 2021-10-13 DIAGNOSIS — M792 Neuralgia and neuritis, unspecified: Secondary | ICD-10-CM

## 2021-10-13 NOTE — Progress Notes (Signed)
  Subjective:  Patient ID: Ashley Barnes, female    DOB: 1985-08-25,  MRN: [id number]  Chief Complaint  Patient presents with   Routine Post Op     POV #4 DOS 06/14/21 --- RIGHT FOOT LAPIDUS BUNIONECTOMY WITH 5TH DIGIT HAMMERTOE WITH FIXATION    DOS: 06/14/2021 Procedure: Right Lapidus bunionectomy with fifth digit hammertoe fixation  36 y.o. female returns for post-op check.  Patient states that she is doing well.  Minimal pain.  She has been weightbearing as tolerated in regular shoes.  She denies any other acute complaints no pain  Review of Systems: Negative except as noted in the HPI. Denies N/V/F/Ch.  Past Medical History:  Diagnosis Date   Abnormal Pap smear    12-29-09 ASCUS+HPV, 01-04-12 LSIL, 01-10-13 ASCUS +HPV   Allergy    Anemia    Asthma    Blood type O+    on donor card from [facility]   Migraines    no aura   Obesity    Recurrent sinusitis     Current Outpatient Medications:    Calcium Citrate-Vitamin D (CALCIUM + D PO), Take by mouth., Disp: , Rfl:    FLUoxetine (PROZAC) 10 MG capsule, Take by mouth., Disp: , Rfl:    Multiple Vitamins-Minerals (MULTIVITAMIN PO), Take by mouth daily., Disp: , Rfl:   Social History   Tobacco Use  Smoking Status Never  Smokeless Tobacco Never    Allergies  Allergen Reactions   Corticosteroids Anaphylaxis and Hives   Penicillins Anaphylaxis   Ursodiol Hives   Zocor [Simvastatin - High Dose] Hives and Rash   Objective:  There were no vitals filed for this visit. There is no height or weight on file to calculate BMI. Constitutional Well developed. Well nourished.  Vascular Foot warm and well perfused. Capillary refill normal to all digits.   Neurologic Normal speech. Oriented to person, place, and time. Epicritic sensation to light touch grossly present bilaterally.  Dermatologic Skin completely epithelialized.  Good correction alignment noted.  Good reduction of bunion deformity noted.  Mild numbness  tingling noted to the right great toe mostly numbness.  Orthopedic: No tenderness to palpation noted about the surgical site.   Radiographs: 3 views of schedule mature the right foot: Good correction alignment noted.  Reduction of bunion deformity noted. Assessment:   1. Neuritis      Plan:  Patient was evaluated and treated and all questions answered.  S/p foot surgery right -Clinically healed and is officially discharged from my care if any foot and ankle issues arise in future advised her to come back and see me.  She states understanding.  She is wearing good shoes and is offloading both of her feet she is functioning well with orthotics.  Numbness tingling right great toe -The patient etiology of numbness tingling regimen options were discussed.  I discussed with her to take her about a whole year to see if they will come back on.  She states understanding.  I discussed with her it is primarily sensory nerve not motor nerve therefore would not affect any movement.  She states understanding.  No follow-ups on file.

## 2022-05-05 DIAGNOSIS — J020 Streptococcal pharyngitis: Secondary | ICD-10-CM | POA: Diagnosis not present

## 2022-05-05 DIAGNOSIS — R059 Cough, unspecified: Secondary | ICD-10-CM | POA: Diagnosis not present

## 2022-05-05 DIAGNOSIS — U071 COVID-19: Secondary | ICD-10-CM | POA: Diagnosis not present

## 2022-05-05 DIAGNOSIS — H6502 Acute serous otitis media, left ear: Secondary | ICD-10-CM | POA: Diagnosis not present

## 2023-09-06 ENCOUNTER — Other Ambulatory Visit: Payer: Self-pay | Admitting: Obstetrics and Gynecology

## 2023-09-06 DIAGNOSIS — Z1231 Encounter for screening mammogram for malignant neoplasm of breast: Secondary | ICD-10-CM

## 2023-09-12 ENCOUNTER — Ambulatory Visit
Admission: RE | Admit: 2023-09-12 | Discharge: 2023-09-12 | Disposition: A | Discharge status: Home / Self Care | Source: Ambulatory Visit

## 2023-09-12 DIAGNOSIS — Z1231 Encounter for screening mammogram for malignant neoplasm of breast: Secondary | ICD-10-CM | POA: Diagnosis not present

## 2023-09-18 ENCOUNTER — Ambulatory Visit: Payer: Self-pay | Admitting: Obstetrics and Gynecology

## 2023-10-06 ENCOUNTER — Encounter: Payer: Self-pay | Admitting: Genetic Counselor

## 2023-10-06 ENCOUNTER — Inpatient Hospital Stay: Attending: Internal Medicine | Admitting: Genetic Counselor

## 2023-10-06 ENCOUNTER — Inpatient Hospital Stay

## 2023-10-06 DIAGNOSIS — Z8041 Family history of malignant neoplasm of ovary: Secondary | ICD-10-CM | POA: Insufficient documentation

## 2023-10-06 DIAGNOSIS — Z803 Family history of malignant neoplasm of breast: Secondary | ICD-10-CM | POA: Diagnosis not present

## 2023-10-06 DIAGNOSIS — Z8042 Family history of malignant neoplasm of prostate: Secondary | ICD-10-CM

## 2023-10-06 DIAGNOSIS — Z800 Family history of malignant neoplasm of digestive organs: Secondary | ICD-10-CM | POA: Insufficient documentation

## 2023-10-06 DIAGNOSIS — Z1379 Encounter for other screening for genetic and chromosomal anomalies: Secondary | ICD-10-CM | POA: Insufficient documentation

## 2023-10-06 DIAGNOSIS — Z8481 Family history of carrier of genetic disease: Secondary | ICD-10-CM

## 2023-10-06 LAB — GENETIC SCREENING ORDER

## 2023-10-06 NOTE — Progress Notes (Signed)
 REFERRING PROVIDER: No referring provider defined for this encounter.  PRIMARY PROVIDER:  Patient, No Pcp Per  PRIMARY REASON FOR VISIT:  1. Family history of BRCA1 gene positive   2. Family history of malignant neoplasm of breast   3. Family history of malignant neoplasm of prostate      HISTORY OF PRESENT ILLNESS:   Ashley Barnes, a 38 y.o. female, was seen for a [place] cancer genetics consultation due to a family history of cancer and family history of a BRCA1 [id number] (p.[id number]) pathogenic variant.  Ashley Barnes presents to clinic today to discuss the possibility of a hereditary predisposition to cancer, to discuss genetic testing, and to further clarify her future cancer risks, as well as potential cancer risks for family members.   Ashley Barnes is a 38 y.o. female with no personal history of cancer.    RELEVANT MEDICAL HISTORY:  Menarche was at age 89.  First live birth at age 19.  OCP use for approximately 3-5 years. Also had nexplanon  implant  Ovaries intact: yes.  Uterus intact: yes.  Menopausal status: premenopausal.  HRT use: 0 years. Colonoscopy: no; not examined. Mammogram within the last year: yes. Density A  Number of breast biopsies: 0. Up to date with pelvic exams: yes. Dermatology: no   Past Medical History:  Diagnosis Date   Abnormal Pap smear    12-29-09 ASCUS+HPV, 01-04-12 LSIL, 01-10-13 ASCUS +HPV   Allergy    Anemia    Asthma    Blood type O+    on donor card from [facility]   Migraines    no aura   Obesity    Recurrent sinusitis     Past Surgical History:  Procedure Laterality Date   BUNIONECTOMY Left 03/15/2021   BUNIONECTOMY Right 06/14/2021   COLPOSCOPY     2011 & 2013 CIN1, 02-06-13    LAPAROSCOPIC GASTRIC SLEEVE RESECTION     nexplanon       inserted 11-20-14, 10-06-17 removed & new one inserted   WISDOM TOOTH EXTRACTION      Social History   Socioeconomic History   Marital status: Single    Spouse name: n/a   Number  of children: 0   Years of education: 14   Highest education level: Not on file  Occupational History   Occupation: Brewing technologist: [employer]  Tobacco Use   Smoking status: Never   Smokeless tobacco: Never  Substance and Sexual Activity   Alcohol use: No    Alcohol/week: 0.0 standard drinks of alcohol   Drug use: No   Sexual activity: Not Currently    Partners: Male    Birth control/protection: Implant    Comment: nexplanon   Other Topics Concern   Not on file  Social History Narrative   Lives alone.   Social Drivers of Corporate investment banker Strain: Not on file  Food Insecurity: Not on file  Transportation Needs: Not on file  Physical Activity: Not on file  Stress: Not on file  Social Connections: Not on file     FAMILY HISTORY:  We obtained a detailed, 4-generation family history.  Significant diagnoses are listed below: Family History  Problem Relation Age of Onset   Diabetes Mother 59   Hypertension Mother    Heart disease Father 67       AMI x 3   Hypertension Father    Breast cancer Barnes    BRCA 1/2 Barnes    Cancer Maternal  Uncle    Breast cancer Maternal Grandmother 47 - 89   Throat cancer Paternal Grandfather    Prostate cancer Paternal Grandfather    Ovarian cancer Other        paternal great aunt x2   Prostate cancer Other        paternal great uncle x2   Colon cancer Other        paternal great uncle   Uterine cancer Other 66       paternal great aunt   Uterine cancer Other    Liver cancer Other    Breast cancer Other 55 - 19       paternal first cousin once removed   Throat cancer Other        paternal first cousin once removed     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      GENETIC COUNSELING ASSESSMENT: Ashley Barnes is a 38 y.o. female with a family history of cancer and a pathogenic BRCA1 variant, putting her at a  50% chance to have the same BRCA1 variant. Pathogenic variants in BRCA1 are associated with hereditary breast and ovarian cancer syndrome. We, therefore, discussed and recommended the following at today's visit.   DISCUSSION:  During the visit, we reviewed the natural history of BRCA1 mutations and increased risk for cancer including breast, female breast, ovarian, pancreatic, and prostate. We also discussed how the identification for a BRCA1 mutation would impact [facility] medical care, per current NCCN guidelines. We reviewed that BRCA1 mutations are inherited in an autosomal dominant pattern, meaning that children, siblings and parents of individuals with a BRCA1 mutation have a 1/2 or 50% chance to have the mutation as well. Both males and females can inherit a BRCA1 mutation and could pass it on to their children.   Additionally, for any family members who are of childbearing age, identification of a BRCA1 mutation may have reproductive implications. In rare cases a child may inherit a BRCA1 mutation from each parent and have a condition called Fanconi Anemia (FA). FA is a childhood onset condition that can be associated with developmental differences, childhood cancer and bone marrow failure, among other health concerns. Therefore, if anyone of childbearing age is found to have a BRCA1 mutation, testing of their reproductive partner should be considered to clarify the chance to have a child with FA.  We discussed that on average, 5 - 10% of cancer is hereditary. There are other genes that can be associated with hereditary  cancer syndromes.  We discussed that testing is beneficial for several reasons, including knowing about other cancer risks, identifying potential screening and risk-reduction options that may be appropriate, and to understanding if other family members could be at risk for cancer and allowing them to undergo genetic testing.  We reviewed the characteristics, features and  inheritance patterns of hereditary cancer syndromes. We also discussed genetic testing, including the appropriate family members to test, the process of testing, insurance coverage and turn-around-time for results. We discussed the implications of a negative, positive, carrier and/or variant of uncertain significant result. We discussed that negative results would be uninformative given that Ms. Hanser does not have a personal history of cancer.   Ms. Aday was offered single site testing for the known familial variant (free under [facility] familial variant testing program),  a common hereditary cancer panel (40+ genes) and an expanded pan-cancer panel (70+ genes). Ms. Lodge was  informed of the benefits and limitations of each panel, including that expanded pan-cancer panels contain several genes that do not have clear management guidelines at this point in time.  We also discussed that as the number of genes included on a panel increases, the chances of variants of uncertain significance increases.  After considering the benefits and limitations of each gene panel, Ms. Laverdiere elected to have [facility] CancerNext-Expanded +RNAinsight panel.   Based on Ms. Ballester's family history of cancer and a BRCA1 variant , she meets medical criteria for genetic testing. Despite that she meets criteria, she may still have an out of pocket cost and understands that a pan-cancer panel will be billed to insurance. We discussed that if her out of pocket cost for testing is over $100, the laboratory should contact them to discuss self-pay prices, patient pay assistance programs, if applicable, and other billing options.  We discussed that some people do not want to undergo genetic testing due to fear of genetic discrimination.  A federal law called the Genetic Information Non-Discrimination Act (GINA) of 2008 helps protect individuals against genetic discrimination based on their genetic test results.  It impacts both health insurance  and employment.  With health insurance, it protects against increased premiums, being kicked off insurance or being forced to take a test in order to be insured.  For employment it protects against hiring, firing and promoting decisions based on genetic test results.  GINA does not apply to those in the [employer], those who work for companies with less than 15 employees, and new life insurance or long-term disability insurance policies.  Health status due to a cancer diagnosis is not protected under GINA.  PLAN: After considering the risks, benefits, and limitations, Ms. Hennings provided informed consent to pursue genetic testing and the blood sample was sent to [facility] for analysis of the CancerNext-Expanded +RNAinsight test. Results should be available within approximately 2-3 weeks' time, at which point they will be disclosed by telephone to Ms. Gilpin, as will any additional recommendations warranted by these results. Ms. Swartzendruber will receive a summary of her genetic counseling visit and a copy of her results once available. This information will also be available in Epic.   Lastly, we encouraged Ms. Mierzejewski to remain in contact with cancer genetics annually so that we can continuously update the family history and inform her of any changes in cancer genetics and testing that may be of benefit for this family.   Ms. Sara questions were answered to her satisfaction today. Our contact information was provided should additional questions or concerns arise. Thank you for the referral and allowing us  to share in the care of your patient.   Burnard Ogren, MS, [facility] Licensed, Retail [email or web address] phone: [phone]   40 minutes were spent on the date of the encounter in service to the patient including preparation, face-to-face consultation, documentation and care coordination.  The patient brought her Barnes, Mliss, to this appointment. Drs. Gudena and/or Lanny  were available to discuss this case as needed.  _______________________________________________________________________ For Office Staff:  Number of people involved in session: 2 Was an Intern/ student involved with case: no

## 2023-10-20 ENCOUNTER — Telehealth: Payer: Self-pay | Admitting: Genetic Counselor

## 2023-10-20 ENCOUNTER — Ambulatory Visit: Payer: Self-pay | Admitting: Genetic Counselor

## 2023-10-20 DIAGNOSIS — Z1501 Genetic susceptibility to malignant neoplasm of breast: Secondary | ICD-10-CM | POA: Insufficient documentation

## 2023-10-20 NOTE — Telephone Encounter (Signed)
 I spoke to Ms. Schmuhl to review results of genetic testing. Genetic testing completed with [facility] CancerNext-Expanded +RNAinsight panel. Testing did identify a pathogenic variant in BRCA1, [id number]. VUS in EGFR. This is the same mutation that had been identified in her sister. We reviewed associated risks, management recommendations, risk to relatives, reproductive risks, and support resources. Interested in referrals to Dr. Aron and [facility]. Please see counseling note for further detail on this result. Portion of result included below.

## 2023-10-20 NOTE — Progress Notes (Signed)
 GENETIC TEST RESULTS  Patient Name: Ashley Barnes Patient Age: 38 y.o. Encounter Date: 10/20/2023  Ashley Barnes was seen in the Cancer Genetics clinic on 10/06/23 due to a family history of cancer and a BRCA1 [id number] ([id number]). Please refer to the prior Genetics clinic note for more information regarding Ashley Barnes's medical and family histories and our assessment at the time.   Results of genetic testing through [facility] CancerNext-Expanded +RNAinsight panel have identified a  pathogenic variant in BRCA1,  c.[id number] (p.T276Afs*14). Results were disclosed to Ashley Barnes via phone on 10/20/23.   FAMILY HISTORY:  We obtained a detailed, 4-generation family history.  Significant diagnoses are listed below: Family History  Problem Relation Age of Onset   Diabetes Mother 29   Hypertension Mother    Heart disease Father 31       AMI x 3   Hypertension Father    Breast cancer Sister    BRCA 1/2 Sister    Cancer Maternal Uncle    Breast cancer Maternal Grandmother 70 - 89   Throat cancer Paternal Grandfather    Prostate cancer Paternal Grandfather    Ovarian cancer Other        paternal great aunt x2   Prostate cancer Other        paternal great uncle x2   Colon cancer Other        paternal great uncle   Uterine cancer Other 17       paternal great aunt   Uterine cancer Other    Liver cancer Other    Breast cancer Other 55 - 67       paternal first cousin once removed   Throat cancer Other        paternal first cousin once removed    GENETIC TESTING:  Genetic testing reported on 10/18/23 through the [facility] CancerNext-Expanded +RNAinsight panel. A single, heterozygous pathogenic variant was detected in the BRCA1 gene called [id number] (p.T276Afs*14). There were no deleterious mutations in the other genes included on this test. The CancerNext-Expanded gene panel offered by [facility] and includes sequencing, rearrangement, and RNA analysis for the following  77 genes: AIP, ALK, APC, ATM, AXIN2, BAP1, BARD1, BMPR1A, BRCA1, BRCA2, BRIP1, CDC73, CDH1, CDK4, CDKN1B, CDKN2A, CEBPA, CHEK2, CTNNA1, DDX41, DICER1, EGFR, EPCAM, ETV6, FH, FLCN, GATA2, GREM1, HOXB13, KIT, LZTR1, MAX, MBD4, MEN1, MET, MITF, MLH1, MSH2, MSH3, MSH6, MUTYH, NF1, NF2, NTHL1, PALB2, PDGFRA, PHOX2B, PMS2, POLD1, POLE, POT1, PRKAR1A, PTCH1, PTEN, RAD51C, RAD51D, RB1, RET, RPS20, RUNX1, SDHA, SDHAF2, SDHB, SDHC, SDHD, SMAD4, SMARCA4, SMARCB1, SMARCE1, STK11, SUFU, TMEM127, TP53, TSC1, TSC2, VHL, WT1.  The test report will be scanned into EPIC and located under the Molecular Pathology section of the Results Review tab.  A portion of the result report is included below for reference.     Genetic testing did identify a variant of uncertain significance (VUS) in the EGFR gene called p.Q71P (c.212A>C).  At this time, it is unknown if this variant is associated with increased cancer risk or if this is a normal finding, but most variants such as this get reclassified to being inconsequential. It should not be used to make medical management decisions or identify at risk family members at this time. With time, we suspect the lab will determine the significance of this variant, if any. If we do learn more about it, we will try to contact Ashley Barnes to discuss it further. However, it is important to stay in touch with us  periodically and keep the  address and phone number up to date.  Clinical Information: Pathogenic variants or mutations in BRCA1 are associated with an increased risk to develop breast cancer, ovarian cancer, prostate cancer, and pancreatic cancer. People with a hereditary mutation in BRCA1 have a condition called Hereditary Breast and Ovarian Cancer (HBOC) Syndrome. The cancer risks and management recommendations associated with BRCA1 mutations are listed below:  Type of Cancer BRCA1 Mutation Lifetime Risk Average Lifetime Risk  Female breast 60-72% 12%  Contralateral, female breast   30-40% over 20 years Less than 10% over 40 years  Female breast Up to 1.2% by age 32 0.1%  Ovarian 39-58% 1-2%  Prostate 7-26% 12%  Pancreatic Up to 5% 1.6%   Limited data suggest there may be a slightly increased risk of serous uterine cancer in women with a BRCA1 mutation. Further research is needed to better understand this association. An increased risk for melanoma skin cancer has been reported in individuals with a BRCA2 pathogenic variant, but current melanoma risks for individuals with BRCA1 pathogenic variants is unclear.   Risk numbers are based on NCCN v3.2025. Risk estimates may evolve over time as new information is learned about BRCA1 mutations.    Management Recommendations: Per NCCN v1.2026, Genetic/Familial High Risk Assessment: Breast, Ovarian, Pancreatic, Prostate.   Breast Screening/Risk Reduction: For women (assigned female at birth):  Screening: Breast awareness starting at age 38 Clinical breast exam every 6-12 months starting at age 64, or sooner depending on family history  Annual breast MRI with contrast beginning at age 43-29 (or annual mammograms with consideration of tomosynthesis if MRI is unavailable), although the age to initiate screening may be individualized based on family history Annual mammogram beginning at age 60 until age 39 with consideration of tomosynthesis with continuation of annual breast MRI with contrast Management should be personalized to people over the age of 40 years old  Surgery:  Prophylactic bilateral risk-reducing mastectomy (RRM), which is the removed of breast tissue before cancer develops, can reduce the risk of a breast cancer by 90% or more depending on the type of surgery. This option can be discussed with a surgeon and a plastic surgeon if breast reconstruction is completed as well.  For women with a BRCA1 pathogenic or likely pathogenic variant who are treated for breast cancer and have not had a bilateral mastectomy, screening  with annual mammogram with consideration of tomosynthesis and breast MRI should continue as described above. Medication (chemoprevention): Consider breast cancer risk reducing medications   For men (assigned female at birth):  Breast self-exam training and education starting at age 41 years Annual clinical breast exam starting at age 87 years  Consider annual mammogram starting at age 64 or 10 years before the earliest known female breast cancer in the family (whichever comes first).   Gynecological Cancer Screening/Risk Reduction: Non-surgical risk reduction: Consultation with a gynecologic oncologist is recommended Consideration of certain types of birth control, thorough discussion of benefits and risks. Oral contraceptive use has been shown to reduce the risk of ovarian cancer by approximately 60% in BRCA1 mutation carriers if taken for at least 5 years. This risk reduction remains even after discontinuation of oral contraceptives.  Ovarian cancer screening is an option for women who chose not to have a RRSO or who, as of yet, have not completed their family. Current screening methods for ovarian cancer are neither sensitive nor specific, meaning that often early stage ovarian cancer cannot be diagnosed through this screening.  Screening can also be falsely  positive with no cancer present. For this reason, RRSO is recommended over screening. If ovarian cancer screening is recommended by your physician, it could include: CA-125 blood tests Transvaginal ultrasounds Clinical pelvic exams Surgical risk reduction:  Risk reducing salpingo-oophorectomy (RRSO, surgery to remove ovaries and fallopian tubes) is recommended beginning at age 50-40 or once childbearing is complete. Recommend that surgery is completed with a provider familiar in screening for cancers, such as a gynecologic oncologist. Removal of fallopian tubes with delayed removal of ovaries may be considered as well This surgery can reduce the  risk of ovarian cancer by approximately 80% and can further reduce breast cancer risk in women who have not yet gone through menopause. There is still a small risk of developing an ovarian-like cancer in the lining of the abdomen, called the peritoneum. Some studies have found a link between BRCA mutations and serous uterine cancer. We do not have enough information to make a recommendation about whether the uterus should be removed along with the ovaries and fallopian tubes. Women should talk about whether to remove the uterus (hysterectomy) with the doctor who will do the surgery. Hormone replacement therapy could be considered based on the physician's discretion. Consider referral to fertility specialist to discuss age related fertility considerations  Prostate Cancer Screening: Annual digital rectal exam (DRE) at age 62 Annual PSA blood test at age 69  Pancreatic Cancer Screening/Risk Reduction: Avoid smoking, heavy alcohol use, and obesity. It has been suggested that pancreatic cancer screening be limited to those with a family history of pancreatic cancer (first- or second-degree relative). Ideally, screening should be performed in experienced centers utilizing a multidisciplinary approach under research conditions. Recommended screening could include annual endoscopic ultrasound (preferred) and/or MRI of the pancreas starting at age 50 or 110 years younger than the earliest age diagnosis in the family. CA19-9 testing in blood may be considered based on the physician's discretion.  Skin Cancer Screening and Risk Reduction: Regular skin self-examinations Individuals should notify their physicians of any changes to moles such as increasing in size, darkening in color, or other change in appearance. Annual skin examinations by a dermatologist  Follow sun-safety recommendations such as: Using UVA and UVB 30 SPF or higher sunscreen Avoiding sunburns Limiting sun exposure, especially during the  hours of 11am-4pm  Wearing protective clothing and sunglasses Avoid using tanning beds  Additional Considerations: Recent studies have suggested PARP inhibitors may be a beneficial chemotherapeutic agent for a subset of patients with BRCA1-associated breast, ovarian, prostate, and pancreatic cancers. Clinical trials are currently in process to determine if and how these agents can be useful in the treatment of BRCA1 cancer patients.  This information is based on current understanding of the gene and may change in the future.  Implications for Family Members: Hereditary predisposition to cancer due to pathogenic variants in the BRCA1 gene has autosomal dominant inheritance. This means that an individual with a pathogenic variant has a 50% chance of passing the condition on to his/her offspring. Identification of a pathogenic variant allows for the recognition of at-risk relatives who can pursue testing for the familial variant.  Family members are encouraged to consider genetic testing for this familial pathogenic variant. As there are generally no childhood cancer risks associated with pathogenic variants in the BRCA1 gene, individuals in the family are not recommended to have testing until they reach at least 40 years of age. They may contact our office at [phone] for more information or to schedule an appointment.  Complimentary testing  for the familial variant is available for 90 days.  Family members who live outside of the area are encouraged to find a genetic counselor in their area by visiting: [email or web address].  Additionally, for any family members who are of childbearing age, identification of a BRCA1 mutation may have reproductive implications. In rare cases a child may inherit a BRCA1 mutation from each parent and have a condition called Fanconi Anemia (FA). FA is a childhood onset condition that can be associated with developmental differences, childhood  cancer and bone marrow failure, among other health concerns. Therefore, if anyone of childbearing age is found to have the familial BRCA1 mutation, testing of their reproductive partner should be considered to clarify the chance to have a child with FA.    For individuals with an identified gene mutation, reproductive options are available including testing on-going pregnancies, testing children when they are of age, or IVF with the option to test embryos for mutations already found in the family. These options may be explored in greater detail with a reproductive or prenatal genetic counselor for more details.  Resources: Facing Our Risk of Cancer Empowered (FORCE): www.facingourrisk.org   Bright Pink: www.brightpink.org Nothing Pink: www.nothingpink.org  [place]ARE Inherited Cancer Registry: https://inheritedcancer.net/  Plan: Ms. Bloomfield is interested in referrals to Gyn Oncology and a Dr[facility]ron at [facility].   Testing is recommended for relatives who are over the age of 15. Will shared a family letter through myChart to help share results with relatives.   We encouraged Ms. Ure to remain in contact with us  on an annual basis so we can update her personal and family histories, and let her know of advances in cancer genetics that may benefit the family. Our contact number was provided. Ms. Bonano questions were answered to her satisfaction today, and she knows she is welcome to call anytime with additional questions.   Burnard Ogren MS, [facility] Licensed, [email or web address] phone: [phone]

## 2023-10-23 ENCOUNTER — Telehealth: Payer: Self-pay | Admitting: *Deleted

## 2023-10-23 ENCOUNTER — Encounter: Payer: Self-pay | Admitting: Obstetrics and Gynecology

## 2023-10-23 ENCOUNTER — Ambulatory Visit (INDEPENDENT_AMBULATORY_CARE_PROVIDER_SITE_OTHER): Admitting: Obstetrics and Gynecology

## 2023-10-23 VITALS — BP 110/58 | HR 88 | Ht 60.43 in | Wt 222.6 lb

## 2023-10-23 DIAGNOSIS — C3490 Malignant neoplasm of unspecified part of unspecified bronchus or lung: Secondary | ICD-10-CM

## 2023-10-23 DIAGNOSIS — Z1501 Genetic susceptibility to malignant neoplasm of breast: Secondary | ICD-10-CM | POA: Diagnosis not present

## 2023-10-23 DIAGNOSIS — Z1509 Genetic susceptibility to other malignant neoplasm: Secondary | ICD-10-CM

## 2023-10-23 NOTE — Progress Notes (Unsigned)
 38 y.o. y.o. 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 Has established with oncology. Found out about brca one with 65 year old sister tested positive as well.  Patient's last menstrual period was 10/22/2023 (exact date). Period Cycle (Days): 24 Period Duration (Days): 5 Period Pattern: Regular Menstrual Flow: Moderate, Heavy Menstrual Control: Tampon Dysmenorrhea: (!) Mild Dysmenorrhea Symptoms: Cramping H/o abnormal pap smears with 2 colposcopy procedures in 2016 normal after.  [place] completed NP AEX Est GYN Care, sister has breast cancer testing came back that she has the brca gene. PAP-05/13/19 Mammo-09/12/23 normal   Body mass index is 42.85 kg/m.  Blood pressure (!) 110/58, pulse 88, height 5' 0.43 (1.535 m), weight 222 lb 9.6 oz (101 kg), last menstrual period 10/22/2023, SpO2 98%, not currently breastfeeding.     Component Value Date/Time   DIAGPAP  05/13/2019 1712    - Negative for intraepithelial lesion or malignancy (NILM)   DIAGPAP  04/13/2017 0000    NEGATIVE FOR INTRAEPITHELIAL LESIONS OR MALIGNANCY.   DIAGPAP  04/13/2017 0000    FUNGAL ORGANISMS PRESENT CONSISTENT WITH CANDIDA SPP.   HPVHIGH Negative 05/13/2019 1712   ADEQPAP  05/13/2019 1712    Satisfactory for evaluation; transformation zone component PRESENT.   ADEQPAP  04/13/2017 0000    Satisfactory for evaluation  endocervical/transformation zone component PRESENT.    GYN HISTORY:    Component Value Date/Time   DIAGPAP  05/13/2019 1712    - Negative for intraepithelial lesion or malignancy (NILM)   DIAGPAP  04/13/2017 0000    NEGATIVE FOR INTRAEPITHELIAL LESIONS OR MALIGNANCY.   DIAGPAP  04/13/2017 0000    FUNGAL ORGANISMS PRESENT CONSISTENT WITH CANDIDA SPP.   HPVHIGH Negative 05/13/2019 1712   ADEQPAP  05/13/2019 1712    Satisfactory for  evaluation; transformation zone component PRESENT.   ADEQPAP  04/13/2017 0000    Satisfactory for evaluation  endocervical/transformation zone component PRESENT.    OB History  Gravida Para Term Preterm AB Living  1 1 1  0 0 1  SAB IAB Ectopic Multiple Live Births  0 0 0 0 1    # Outcome Date GA Lbr Len/2nd Weight Sex Type Anes PTL Lv  1 Term 07/29/14 [redacted]w[redacted]d 324:49 / 00:59 7 lb 11.6 oz (3.505 kg) F Vag-Spont EPI  LIV    Past Medical History:  Diagnosis Date  . Abnormal Pap smear    12-29-09 ASCUS+HPV, 01-04-12 LSIL, 01-10-13 ASCUS +HPV  . Allergy   . Anemia   . Asthma   . Blood type O+    on donor card from [facility]  . Migraines    no aura  . Obesity   . Recurrent sinusitis     Past Surgical History:  Procedure Laterality Date  . BUNIONECTOMY Left 03/15/2021  . BUNIONECTOMY Right 06/14/2021  . 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 LAPAROSCOPIC GASTRIC SLEEVE RESECTION    . nexplanon       inserted 11-20-14, 10-06-17 removed & new one inserted  . tummy tuck     july 2023  . WISDOM TOOTH EXTRACTION      Current Outpatient Medications on File Prior to Visit  Medication Sig Dispense Refill  . Multiple Vitamins-Minerals (MULTIVITAMIN PO) Take by mouth daily.     No current facility-administered medications on file prior  to visit.    Social History   Socioeconomic History  . Marital status: Single    Spouse name: n/a  . Number of children: 0  . Years of education: 10  . Highest education level: Not on file  Occupational History  . Occupation: Brewing technologist: [employer]  Tobacco Use  . Smoking status: Never  . Smokeless tobacco: Never  Substance and Sexual Activity  . Alcohol use: No    Alcohol/week: 0.0 standard drinks of alcohol  . Drug use: No  . Sexual activity: Yes    Partners: Male    Birth control/protection: Surgical    Comment: vasectomy  Other Topics Concern  . Not on file  Social History Narrative   Lives  alone.   Social Drivers of Corporate investment banker Strain: Not on file  Food Insecurity: Not on file  Transportation Needs: Not on file  Physical Activity: Not on file  Stress: Not on file  Social Connections: Not on file  Intimate Partner Violence: Not on file    Family History  Problem Relation Age of Onset  . Diabetes Mother 40  . Hypertension Mother   . Heart disease Father 5       AMI x 3  . Hypertension Father   . Breast cancer Sister   . BRCA 1/2 Sister   . Cancer Maternal Uncle   . Breast cancer Maternal Grandmother 80 - 89  . Throat cancer Paternal Grandfather   . Prostate cancer Paternal Grandfather   . Ovarian cancer Other        paternal great aunt x2  . Prostate cancer Other        paternal great uncle x2  . Colon cancer Other        paternal great uncle  . Uterine cancer Other 81       paternal great aunt  . Uterine cancer Other   . Liver cancer Other   . Breast cancer Other 70 - 85       paternal first cousin once removed  . Throat cancer Other        paternal first cousin once removed     Allergies  Allergen Reactions  . Corticosteroids Anaphylaxis and Hives  . Penicillins Anaphylaxis  . Ursodiol Hives  . Zocor [Simvastatin - High Dose] Hives and Rash      Patient's last menstrual period was Patient's last menstrual period was 10/22/2023 (exact date)..          Sexually active: ***  Exercising: ***   Review of Systems Alls systems reviewed and are negative.     OBGyn Exam    A:        establish care with brca1                             P:        Pap smear {Pap indication:31164} Encouraged annual mammogram screening Colon cancer screening {Colon cancer screening:31170} DXA {DXA screening:31171} Labs and immunizations {annual labs:31172} Discussed breast self exams Encouraged healthy lifestyle practices Encouraged Vit D and Calcium   No follow-ups on file.  Ashley Barnes

## 2023-10-23 NOTE — Telephone Encounter (Signed)
 Spoke with the patient to schedule appt. Patient at another MD office; patient will call the office back later today

## 2023-10-24 ENCOUNTER — Ambulatory Visit: Payer: Self-pay | Admitting: Obstetrics and Gynecology

## 2023-10-24 LAB — CA 125: CA 125: 7 U/mL

## 2023-10-24 NOTE — Telephone Encounter (Signed)
 Spoke with the patient regarding the referral to GYN oncology. Patient scheduled as new patient with Dr Olam Mill on 9/3 at 9:15 am. Patient given an arrival time of 8:45 am.  Explained to the patient the the doctor will perform a pelvic exam at this visit. Patient given the policy that only one visitor allowed and that visitor must be over 16 yrs are allowed in the Cancer Center. Patient given the address/phone number for the clinic and that the center offers free valet service. Patient aware that masks required.

## 2023-10-25 ENCOUNTER — Encounter: Payer: Self-pay | Admitting: Internal Medicine

## 2023-10-25 ENCOUNTER — Ambulatory Visit: Admitting: Physician Assistant

## 2023-10-25 ENCOUNTER — Ambulatory Visit: Admitting: Internal Medicine

## 2023-10-25 ENCOUNTER — Encounter: Payer: Self-pay | Admitting: Physician Assistant

## 2023-10-25 VITALS — BP 129/87 | HR 84

## 2023-10-25 VITALS — BP 124/72 | HR 90 | Ht 60.43 in | Wt 224.0 lb

## 2023-10-25 DIAGNOSIS — L821 Other seborrheic keratosis: Secondary | ICD-10-CM

## 2023-10-25 DIAGNOSIS — D229 Melanocytic nevi, unspecified: Secondary | ICD-10-CM

## 2023-10-25 DIAGNOSIS — Z1283 Encounter for screening for malignant neoplasm of skin: Secondary | ICD-10-CM | POA: Diagnosis not present

## 2023-10-25 DIAGNOSIS — Z1501 Genetic susceptibility to malignant neoplasm of breast: Secondary | ICD-10-CM

## 2023-10-25 DIAGNOSIS — K649 Unspecified hemorrhoids: Secondary | ICD-10-CM | POA: Diagnosis not present

## 2023-10-25 DIAGNOSIS — D1801 Hemangioma of skin and subcutaneous tissue: Secondary | ICD-10-CM

## 2023-10-25 DIAGNOSIS — K5900 Constipation, unspecified: Secondary | ICD-10-CM

## 2023-10-25 DIAGNOSIS — Z903 Acquired absence of stomach [part of]: Secondary | ICD-10-CM

## 2023-10-25 DIAGNOSIS — W908XXA Exposure to other nonionizing radiation, initial encounter: Secondary | ICD-10-CM

## 2023-10-25 DIAGNOSIS — L578 Other skin changes due to chronic exposure to nonionizing radiation: Secondary | ICD-10-CM

## 2023-10-25 DIAGNOSIS — L814 Other melanin hyperpigmentation: Secondary | ICD-10-CM | POA: Diagnosis not present

## 2023-10-25 DIAGNOSIS — Z83719 Family history of colon polyps, unspecified: Secondary | ICD-10-CM

## 2023-10-25 MED ORDER — NA SULFATE-K SULFATE-MG SULF 17.5-3.13-1.6 GM/177ML PO SOLN: 1.0000 | Freq: Once | ORAL | 0 refills | Status: AC

## 2023-10-25 NOTE — Progress Notes (Signed)
 HISTORY OF PRESENT ILLNESS:  Ashley Barnes is a 38 y.o. 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  Gene testing was pursued after her sister was diagnosed with breast cancer at a young age.  As noted, she is BRCA1 positive.  Both her father and mother have a history of colon polyps.  Her mother had an advanced lesion requiring segmental resection of her colon.  There is colon cancer on her father side.  Her brother, in his 53s, had 7 polyps that were precancerous.  Her GI review of systems is remarkable for stable constipation and hemorrhoids.  With her sleeve gastrectomy she lost approximately 100 pounds, but has gained back approximately 40.  No rectal bleeding or other complaints.  She is accompanied today by her 42-year-old daughter.  Review of outside blood work shows a normal CA125  REVIEW OF SYSTEMS:  All non-GI ROS negative except for menstrual cramps  Past Medical History:  Diagnosis Date   Abnormal Pap smear    12-29-09 ASCUS+HPV, 01-04-12 LSIL, 01-10-13 ASCUS +HPV   Allergy    Anemia    Asthma    Blood type O+    on [facility] card [facility]   Migraines    no aura   Obesity    Recurrent sinusitis     Past Surgical History:  Procedure Laterality Date   BUNIONECTOMY Left 03/15/2021   BUNIONECTOMY Right 06/14/2021   COLPOSCOPY     2011 & 2013 CIN1, 02-06-13    LAPAROSCOPIC GASTRIC SLEEVE RESECTION     nexplanon       inserted 11-20-14, 10-06-17 removed & new one inserted   tummy tuck     july 2023   WISDOM TOOTH EXTRACTION      Social History [name]  reports that she has never smoked. She has never used smokeless tobacco. She reports that she does not drink alcohol and does not use drugs.  family history includes BRCA 1/2 in her sister;  Breast cancer in her sister; Breast cancer (age of onset: 40 - 14) in an other family member; Breast cancer (age of onset: 18 - 36) in her maternal grandmother; Cancer in her maternal uncle; Colon cancer in an other family member; Diabetes (age of onset: 73) in her mother; Heart disease (age of onset: 73) in her father; Hypertension in her father and mother; Liver cancer in an other family member; Ovarian cancer in an other family member; Prostate cancer in her paternal grandfather and another family member; Throat cancer in her paternal grandfather and another family member; Uterine cancer in an other family member; Uterine cancer (age of onset: 1) in an other family member.  Allergies  Allergen Reactions   Corticosteroids Anaphylaxis and Hives   Penicillins Anaphylaxis   Ursodiol Hives   Zocor [Simvastatin - High Dose] Hives and Rash       PHYSICAL EXAMINATION: Vital signs: BP 124/72   Pulse 90   Ht 5' 0.43 (1.535 m)   Wt 224 lb (101.6 kg)   LMP 10/22/2023 (Exact Date)   BMI 43.13 kg/m   Constitutional: generally well-appearing, no acute distress Psychiatric: alert and oriented x3, cooperative Eyes: extraocular movements intact, anicteric, conjunctiva pink Mouth: oral pharynx moist, no lesions Neck: supple no lymphadenopathy Cardiovascular: heart regular rate and rhythm, no murmur Lungs: clear to auscultation  bilaterally Abdomen: soft, nontender, nondistended, no obvious ascites, no peritoneal signs, normal bowel sounds, no organomegaly Rectal: Deferred until colonoscopy Extremities: no, cyanosis, or lower extremity edema bilaterally Skin: no lesions on visible extremities Neuro: No focal deficits.  Cranial nerves intact  ASSESSMENT:  1.  BRCA1 positive.  The correlation between BRCA 1 oncogene and colon cancer is uncertain, the some studies have shown increased risk.  No first-degree relatives with pancreatic cancer. 2.  Strong family history of colorectal neoplasia as  described. 3.  Morbid obesity 4.  Status post sleeve gastrectomy 17 5.  Stable constipation intermittent symptomatic hemorrhoids   PLAN:  1.  Discussion today on BRCA gene and its relevance to GI malignancy. 2.  Colonoscopy.  The patient has high risk for colorectal neoplasia given her family history and genetics.  She is appropriate candidate for colonoscopy at this time without contraindication.  She is higher than average risk due to her BMI 3.  Exercise and weight loss 4.  Continue close monitoring with other specialists regarding risks of respective malignancies A total time of 60 minutes was spent preparing to see the patient, reviewing multiple records, obtaining comprehensive history, performing medically appropriate physical examination, counseling and educating the patient regarding the above listed issues, ordering endoscopic procedure, and documenting clinical information in the health record

## 2023-10-25 NOTE — Progress Notes (Signed)
   New Patient Visit   Subjective  Ashley Barnes is a 38 y.o. female NEW PATIENT who presents for the following: Pt states she does not have any places of concern pt would just like to be checked. Pt denies any history of skin cancer herself or family. Recently had genetic testing as her sister had breast cancer - is known to be BRACA1 positive.     The following portions of the chart were reviewed this encounter and updated as appropriate: medications, allergies, medical history  Review of Systems:  No other skin or systemic complaints except as noted in HPI or Assessment and Plan.  Objective  Well appearing patient in no apparent distress; mood and affect are within normal limits.  A full examination was performed including scalp, head, eyes, ears, nose, lips, neck, chest, axillae, abdomen, back, buttocks, bilateral upper extremities, bilateral lower extremities, hands, feet, fingers, toes, fingernails, and toenails. All findings within normal limits unless otherwise noted below.    Relevant exam findings are noted in the Assessment and Plan.    Assessment & Plan  LENTIGINES, SEBORRHEIC KERATOSES, HEMANGIOMAS - Benign normal skin lesions - Benign-appearing - Call for any changes  MELANOCYTIC NEVI - Tan-brown and/or pink-flesh-colored symmetric macules and papules - Benign appearing on exam today - Observation - Call clinic for new or changing moles - Recommend daily use of broad spectrum spf 30+ sunscreen to sun-exposed areas.   ACTINIC DAMAGE - Chronic condition, secondary to cumulative UV/sun exposure - diffuse scaly erythematous macules with underlying dyspigmentation - Recommend daily broad spectrum sunscreen SPF 30+ to sun-exposed areas, reapply every 2 hours as needed.  - Staying in the shade or wearing long sleeves, sun glasses (UVA+UVB protection) and wide brim hats (4-inch brim around the entire circumference of the hat) are also recommended for sun protection.  -  Call for new or changing lesions.  SKIN CANCER SCREENING PERFORMED TODAY   BRACA1 POSITVE  - reviewed increased risk of pancreatic, prostate and melanoma (BRACA2 only)   LENTIGINES   SEBORRHEIC KERATOSIS   CHERRY ANGIOMA   MULTIPLE BENIGN NEVI   ACTINIC SKIN DAMAGE   SCREENING EXAM FOR SKIN CANCER   BRCA GENE MUTATION POSITIVE IN FEMALE    Return in about 1 year (around 10/24/2024) for TBSE.  I, Doyce Pan, CMA, am acting as scribe for Cassondra Stachowski K, PA-C.  Documentation: I have reviewed the above documentation for accuracy and completeness, and I agree with the above.  Kahle Mcqueen K, PA-C

## 2023-10-25 NOTE — Patient Instructions (Signed)
 You have been scheduled for a colonoscopy. Please follow written instructions given to you at your visit today.   If you use inhalers (even only as needed), please bring them with you on the day of your procedure.  DO NOT TAKE 7 DAYS PRIOR TO TEST- Trulicity (dulaglutide) Ozempic, Wegovy (semaglutide) Mounjaro (tirzepatide) Bydureon Bcise (exanatide extended release)  DO NOT TAKE 1 DAY PRIOR TO YOUR TEST Rybelsus (semaglutide) Adlyxin (lixisenatide) Victoza (liraglutide) Byetta (exanatide) ___________________________________________________________________________  _______________________________________________________  If your blood pressure at your visit was 140/90 or greater, please contact your primary care physician to follow up on this.  _______________________________________________________  If you are age 30 or older, your body mass index should be between 23-30. Your Body mass index is 43.13 kg/m. If this is out of the aforementioned range listed, please consider follow up with your Primary Care Provider.  If you are age 7 or younger, your body mass index should be between 19-25. Your Body mass index is 43.13 kg/m. If this is out of the aformentioned range listed, please consider follow up with your Primary Care Provider.   ________________________________________________________  The [facility] providers would like to encourage you to use MYCHART to communicate with providers for non-urgent requests or questions.  Due to long hold times on the telephone, sending your provider a message by [facility] may be a faster and more efficient way to get a response.  Please allow 48 business hours for a response.  Please remember that this is for non-urgent requests.  _______________________________________________________  [facility] is using a team-based approach to care.  Your team is made up of your doctor and two to three APPS. Our APPS (Nurse Practitioners and  Physician Assistants) work with your physician to ensure care continuity for you. They are fully qualified to address your health concerns and develop a treatment plan. They communicate directly with your gastroenterologist to care for you. Seeing the Advanced Practice Practitioners on your physician's team can help you by facilitating care more promptly, often allowing for earlier appointments, access to diagnostic testing, procedures, and other specialty referrals.

## 2023-10-25 NOTE — Patient Instructions (Signed)
Patient Handout: Wound Care for Skin Biopsy Site  Taking Care of Your Skin Biopsy Site  Proper care of the biopsy site is essential for promoting healing and minimizing scarring. This handout provides instructions on how to care for your biopsy site to ensure optimal recovery.  1. Cleaning the Wound:  Clean the biopsy site daily with gentle soap and water. Gently pat the area dry with a clean, soft towel. Avoid harsh scrubbing or rubbing the area, as this can irritate the skin and delay healing. 2. Applying Aquaphor and Bandage:  After cleaning the wound, apply a thin layer of Aquaphor ointment to the biopsy site. Cover the area with a sterile bandage to protect it from dirt, bacteria, and friction. Change the bandage daily or as needed if it becomes soiled or wet. 3. Continued Care for One Week:  Repeat the cleaning, Aquaphor application, and bandaging process daily for one week following the biopsy procedure. Keeping the wound clean and moist during this initial healing period will help prevent infection and promote optimal healing. 4. Massaging Aquaphor into the Area:  After one week, discontinue the use of bandages but continue to apply Aquaphor to the biopsy site. Gently massage the Aquaphor into the area using circular motions. Massaging the skin helps to promote circulation and prevent the formation of scar tissue. Additional Tips:  Avoid exposing the biopsy site to direct sunlight during the healing process, as this can cause hyperpigmentation or worsen scarring. If you experience any signs of infection, such as increased redness, swelling, warmth, or drainage from the wound, contact your healthcare provider immediately. Follow any additional instructions provided by your healthcare provider for caring for the biopsy site and managing any discomfort. Conclusion:  Taking proper care of your skin biopsy site is crucial for ensuring optimal healing and minimizing scarring. By  following these instructions for cleaning, applying Aquaphor, and massaging the area, you can promote a smooth and successful recovery. If you have any questions or concerns about caring for your biopsy site, don't hesitate to contact your healthcare provider for guidance.  

## 2023-10-26 ENCOUNTER — Encounter: Payer: Self-pay | Admitting: *Deleted

## 2023-11-07 ENCOUNTER — Encounter: Payer: Self-pay | Admitting: Obstetrics & Gynecology

## 2023-11-07 DIAGNOSIS — R92313 Mammographic fatty tissue density, bilateral breasts: Secondary | ICD-10-CM | POA: Diagnosis not present

## 2023-11-07 DIAGNOSIS — Z1509 Genetic susceptibility to other malignant neoplasm: Secondary | ICD-10-CM | POA: Diagnosis not present

## 2023-11-07 DIAGNOSIS — Z1501 Genetic susceptibility to malignant neoplasm of breast: Secondary | ICD-10-CM | POA: Diagnosis not present

## 2023-11-08 ENCOUNTER — Inpatient Hospital Stay: Attending: Obstetrics & Gynecology | Admitting: Obstetrics & Gynecology

## 2023-11-08 ENCOUNTER — Encounter: Payer: Self-pay | Admitting: Internal Medicine

## 2023-11-08 ENCOUNTER — Other Ambulatory Visit: Payer: Self-pay | Admitting: Medical Genetics

## 2023-11-08 ENCOUNTER — Encounter: Payer: Self-pay | Admitting: Obstetrics & Gynecology

## 2023-11-08 ENCOUNTER — Other Ambulatory Visit: Payer: Self-pay | Admitting: General Surgery

## 2023-11-08 ENCOUNTER — Inpatient Hospital Stay

## 2023-11-08 VITALS — BP 105/55 | HR 63 | Temp 98.8°F | Resp 16 | Ht 60.43 in | Wt 220.0 lb

## 2023-11-08 DIAGNOSIS — Z1501 Genetic susceptibility to malignant neoplasm of breast: Secondary | ICD-10-CM | POA: Insufficient documentation

## 2023-11-08 DIAGNOSIS — Z1502 Genetic susceptibility to malignant neoplasm of ovary: Secondary | ICD-10-CM | POA: Diagnosis not present

## 2023-11-08 DIAGNOSIS — Z148 Genetic carrier of other disease: Secondary | ICD-10-CM | POA: Diagnosis not present

## 2023-11-08 DIAGNOSIS — Z1509 Genetic susceptibility to other malignant neoplasm: Secondary | ICD-10-CM | POA: Diagnosis not present

## 2023-11-08 LAB — HEMOGLOBIN A1C
Hgb A1c MFr Bld: 5.1 % (ref 4.8–5.6)
Mean Plasma Glucose: 99.67 mg/dL

## 2023-11-08 NOTE — Progress Notes (Signed)
 Follow Up Note: Gyn-Onc  Ashley Barnes 38 y.o. female  CC: BRCA1 mutation carrier   HPI: Results of genetic testing through [employer] [facility] [employer] CancerNext-Expanded +RNAinsight panel have identified a  pathogenic variant in BRCA1. She presents for consultation for possible RRSO. A CA 125 in 8/25 was 7.  A TVUS has been scheduled.    Review of Systems  Review of Systems  Constitutional:  Negative for malaise/fatigue and weight loss.  Respiratory:  Negative for shortness of breath and wheezing.   Cardiovascular:  Negative for chest pain and leg swelling.  Gastrointestinal:  Negative for abdominal pain, blood in stool, constipation, nausea and vomiting.  Genitourinary:  Negative for dysuria, frequency, hematuria and urgency.  Musculoskeletal:  Negative for joint pain and myalgias.  Neurological:  Negative for weakness.  Psychiatric/Behavioral:  Negative for depression. The patient does not have insomnia.    Current medications, allergy, social history, past surgical history, past medical history, family history were all reviewed.    Vitals: BP (!) 105/55 (BP Location: Right Arm, Patient Position: Sitting)   Pulse 63   Temp 98.8 F (37.1 C) (Oral)   Resp 16   Ht 5' 0.43 (1.535 m)   Wt 220 lb (99.8 kg)   LMP 10/22/2023 (Exact Date)   SpO2 100%   BMI 42.36 kg/m    Physical Exam:  Physical Exam Exam conducted with a chaperone present.  Constitutional:      General: She is not in acute distress. Cardiovascular:     Rate and Rhythm: Normal rate and regular rhythm.  Pulmonary:     Effort: Pulmonary effort is normal.     Breath sounds: Normal breath sounds. No wheezing or rhonchi.  Abdominal:     Palpations: Abdomen is soft.     Tenderness: There is no abdominal tenderness. There is no right CVA tenderness or left CVA tenderness.     Hernia: No hernia is present.  Genitourinary:    General: Normal vulva.     Urethra: No urethral lesion.     Vagina: No lesions. No bleeding Uterus:  Cx deviated to the left, possible mild uterine enlargement Musculoskeletal:     Cervical back: Neck supple.     Right lower leg: No edema.     Left lower leg: No edema.  Lymphadenopathy:     Upper Body:     Right upper body: No supraclavicular adenopathy.     Left upper body: No supraclavicular adenopathy.     Lower Body: No right inguinal adenopathy. No left inguinal adenopathy.  Skin:    Findings: No rash.  Neurological:     Mental Status: She is oriented to person, place, and time.   Assessment/Plan:  Assessment: The patient is 38 y.o. year old BRCA1 mutation carrier who presents for consultation for a rrBSO.     Plan: We reviewed the patient's specific anatomic and functional findings with her, with the assistance of diagrams and handouts.  We reviewed the treatment options including expectant, conservative, medical, and surgical management and she is opting for surgical management.  We reviewed the benefits and risks of each treatment option.    For all procedures, we discussed risks of bleeding, infection, damage to surrounding organs including bowel, bladder, blood vessels, ureters,  numbness and weakness at any body site and the rarer risks of blood clot, heart attack, pneumonia, death.   We also discussed the possible conversion to laparoscopy or laparotomy.  The discussion included the chance of detecting occult malignancy either at the  time of surgery or in the final pathology and potential need for additional surgery. Patients at high risk for ovarian cancer may develop primary peritoneal carcinoma and remain at risk for this cancer after rrBSO. Consequences of rrBSO other than cancer risk reduction, including  premature menopause were discussed. In particular, issues relating to menopause (eg, vaginal dryness, changes in libido and sexual function, sleep disturbances, hot flashes, osteoporosis, mood changes, risk of coronary artery disease) and its treatment were reviewed.  All her  questions were answered and she verbalized understanding.    Orders Placed This Encounter  Procedures   Hemoglobin A1c    Standing Status:   Future    Number of Occurrences:   1    Expiration Date:   11/07/2024    Review the TVUS findings  Return in 2/26 for a virtual preop visit and surgery scheduling    I personally spent 30 minutes face-to-face and non-face-to-face in the care of this patient, which includes all pre, intra, and post visit time on the date of service.    Olam Mill, MD

## 2023-11-08 NOTE — Patient Instructions (Signed)
 Return in 2/26 for preop visit.  Can schedule a phone or virtual video visit.

## 2023-11-09 ENCOUNTER — Ambulatory Visit: Payer: Self-pay

## 2023-11-09 NOTE — Telephone Encounter (Signed)
-----   Message from Eleanor JONETTA Epps sent at 11/09/2023 12:55 PM EDT ----- Please let the patient know her Hemoglobin A1C. She is in the non-diabetic range ----- Message ----- From: Interface, Lab In [place] Sent: 11/08/2023   2:24 PM EDT To: Olam Mill, MD

## 2023-11-09 NOTE — Telephone Encounter (Signed)
 Ashley Barnes is aware of her normal HgA1C level. She was thankful for the call

## 2023-11-14 ENCOUNTER — Encounter: Payer: Self-pay | Admitting: Obstetrics and Gynecology

## 2023-11-15 ENCOUNTER — Ambulatory Visit (AMBULATORY_SURGERY_CENTER): Admitting: Internal Medicine

## 2023-11-15 ENCOUNTER — Encounter: Payer: Self-pay | Admitting: Internal Medicine

## 2023-11-15 VITALS — BP 129/75 | HR 57 | Temp 97.9°F | Resp 14 | Ht 60.0 in | Wt 224.0 lb

## 2023-11-15 DIAGNOSIS — Z83719 Family history of colon polyps, unspecified: Secondary | ICD-10-CM | POA: Diagnosis not present

## 2023-11-15 DIAGNOSIS — Z1211 Encounter for screening for malignant neoplasm of colon: Secondary | ICD-10-CM

## 2023-11-15 MED ORDER — SODIUM CHLORIDE 0.9 % IV SOLN: 500.0000 mL | Freq: Once | INTRAVENOUS | Status: DC

## 2023-11-15 NOTE — Progress Notes (Signed)
 Expand All Collapse All HISTORY OF PRESENT ILLNESS:   Ashley Barnes is a 38 y.o. 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   Gene testing was pursued after her sister was diagnosed with breast cancer at a young age.  As noted, she is BRCA1 positive.  Both her father and mother have a history of colon polyps.  Her mother had an advanced lesion requiring segmental resection of her colon.  There is colon cancer on her father side.  Her brother, in his 21s, had 7 polyps that were precancerous.   Her GI review of systems is remarkable for stable constipation and hemorrhoids.  With her sleeve gastrectomy she lost approximately 100 pounds, but has gained back approximately 40.  No rectal bleeding or other complaints.  She is accompanied today by her 106-year-old daughter.   Review of outside blood work shows a normal CA125   REVIEW OF SYSTEMS:   All non-GI ROS negative except for menstrual cramps       Past Medical History:  Diagnosis Date   Abnormal Pap smear      12-29-09 ASCUS+HPV, 01-04-12 LSIL, 01-10-13 ASCUS +HPV   Allergy     Anemia     Asthma     Blood type O+      [facility]   Migraines      no aura   Obesity     Recurrent sinusitis                 Past Surgical History:  Procedure Laterality Date   BUNIONECTOMY Left 03/15/2021   BUNIONECTOMY Right 06/14/2021   COLPOSCOPY        2011 & 2013 CIN1, 02-06-13    LAPAROSCOPIC GASTRIC SLEEVE RESECTION       nexplanon          inserted 11-20-14, 10-06-17 removed & new one inserted   tummy tuck        july 2023   WISDOM TOOTH EXTRACTION              Social History Cybele Maule  reports that she has never smoked. She has never used smokeless tobacco. She reports that she does not drink  alcohol and does not use drugs.   family history includes BRCA 1/2 in her sister; Breast cancer in her sister; Breast cancer (age of onset: 92 - 29) in an other family member; Breast cancer (age of onset: 14 - 46) in her maternal grandmother; Cancer in her maternal uncle; Colon cancer in an other family member; Diabetes (age of onset: 62) in her mother; Heart disease (age of onset: 15) in her father; Hypertension in her father and mother; Liver cancer in an other family member; Ovarian cancer in an other family member; Prostate cancer in her paternal grandfather and another family member; Throat cancer in her paternal grandfather and another family member; Uterine cancer in an other family member; Uterine cancer (age of onset: 63) in an other family member.   Allergies      Allergies  Allergen Reactions   Corticosteroids Anaphylaxis and Hives   Penicillins Anaphylaxis   Ursodiol Hives   Zocor [Simvastatin - High Dose] Hives and Rash            PHYSICAL EXAMINATION: Vital signs: BP 124/72   Pulse  90   Ht 5' 0.43 (1.535 m)   Wt 224 lb (101.6 kg)   LMP 10/22/2023 (Exact Date)   BMI 43.13 kg/m   Constitutional: generally well-appearing, no acute distress Psychiatric: alert and oriented x3, cooperative Eyes: extraocular movements intact, anicteric, conjunctiva pink Mouth: oral pharynx moist, no lesions Neck: supple no lymphadenopathy Cardiovascular: heart regular rate and rhythm, no murmur Lungs: clear to auscultation bilaterally Abdomen: soft, nontender, nondistended, no obvious ascites, no peritoneal signs, normal bowel sounds, no organomegaly Rectal: Deferred until colonoscopy Extremities: no, cyanosis, or lower extremity edema bilaterally Skin: no lesions on visible extremities Neuro: No focal deficits.  Cranial nerves intact   ASSESSMENT:   1.  BRCA1 positive.  The correlation between BRCA 1 oncogene and colon cancer is uncertain, the some studies have shown increased risk.   No first-degree relatives with pancreatic cancer. 2.  Strong family history of colorectal neoplasia as described. 3.  Morbid obesity 4.  Status post sleeve gastrectomy 17 5.  Stable constipation intermittent symptomatic hemorrhoids     PLAN:   1.  Discussion today on BRCA gene and its relevance to GI malignancy. 2.  Colonoscopy.  The patient has high risk for colorectal neoplasia given her family history and genetics.  She is appropriate candidate for colonoscopy at this time without contraindication.  She is higher than average risk due to her BMI 3.  Exercise and weight loss 4.  Continue close monitoring with other specialists regarding risks of respective malignancies

## 2023-11-15 NOTE — Op Note (Signed)
 [facility] Patient Name: Ashley Barnes Procedure Date: 11/15/2023 1:59 PM MRN: [id number] Endoscopist: Norleen SAILOR. Abran , MD, [id number] Age: 38 Referring MD:  Date of Birth: October 30, 1985 Gender: Female Account #: [id number] Procedure:                Colonoscopy Indications:              Colon cancer screening in patient with 1st-degree                            relative having advanced adenoma of the colon                            before age 69; multiple siblings with adenomatous                            polyps at early age. BRCA 1 positive Medicines:                Monitored Anesthesia Care Procedure:                Pre-Anesthesia Assessment:                           - Prior to the procedure, a History and Physical                            was performed, and patient medications and                            allergies were reviewed. The patient's tolerance of                            previous anesthesia was also reviewed. The risks                            and benefits of the procedure and the sedation                            options and risks were discussed with the patient.                            All questions were answered, and informed consent                            was obtained. Prior Anticoagulants: The patient has                            taken no anticoagulant or antiplatelet agents. ASA                            Grade Assessment: II - A patient with mild systemic                            disease. After reviewing the risks and benefits,  the patient was deemed in satisfactory condition to                            undergo the procedure.                           After obtaining informed consent, the colonoscope                            was passed under direct vision. Throughout the                            procedure, the patient's blood pressure, pulse, and                            oxygen saturations were  monitored continuously. The                            Olympus Scope [id number] was introduced through the                            anus and advanced to the the cecum, identified by                            appendiceal orifice and ileocecal valve. The                            ileocecal valve, appendiceal orifice, and rectum                            were photographed. The quality of the bowel                            preparation was excellent. The colonoscopy was                            performed without difficulty. The patient tolerated                            the procedure well. The bowel preparation used was                            SUPREP via split dose instruction. Scope In: 2:16:50 PM Scope Out: 2:27:53 PM Scope Withdrawal Time: 0 hours 8 minutes 32 seconds  Total Procedure Duration: 0 hours 11 minutes 3 seconds  Findings:                 The entire examined colon appeared normal on direct                            and retroflexion views. Complications:            No immediate complications. Estimated blood loss:  None. Estimated Blood Loss:     Estimated blood loss: none. Impression:               - The entire examined colon is normal on direct and                            retroflexion views.                           - No specimens collected. Recommendation:           - Repeat colonoscopy in 5 years for screening                            purposes (family history).                           - Patient has a contact number available for                            emergencies. The signs and symptoms of potential                            delayed complications were discussed with the                            patient. Return to normal activities tomorrow.                            Written discharge instructions were provided to the                            patient.                           - Resume previous diet.                            - Continue present medications. Norleen SAILOR. Abran, MD 11/15/2023 2:34:23 PM This report has been signed electronically.

## 2023-11-15 NOTE — Patient Instructions (Signed)
 Resume previous diet and medications. Repeat Colonoscopy date in 5 years for screening purposes.  YOU HAD AN ENDOSCOPIC PROCEDURE TODAY AT THE [facility]:   Refer to the procedure report that was given to you for any specific questions about what was found during the examination.  If the procedure report does not answer your questions, please call your gastroenterologist to clarify.  If you requested that your care partner not be given the details of your procedure findings, then the procedure report has been included in a sealed envelope for you to review at your convenience later.  YOU SHOULD EXPECT: Some feelings of bloating in the abdomen. Passage of more gas than usual.  Walking can help get rid of the air that was put into your GI tract during the procedure and reduce the bloating. If you had a lower endoscopy (such as a colonoscopy or flexible sigmoidoscopy) you may notice spotting of blood in your stool or on the toilet paper. If you underwent a bowel prep for your procedure, you may not have a normal bowel movement for a few days.  Please Note:  You might notice some irritation and congestion in your nose or some drainage.  This is from the oxygen used during your procedure.  There is no need for concern and it should clear up in a day or so.  SYMPTOMS TO REPORT IMMEDIATELY:  Following lower endoscopy (colonoscopy or flexible sigmoidoscopy):  Excessive amounts of blood in the stool  Significant tenderness or worsening of abdominal pains  Swelling of the abdomen that is new, acute  Fever of 100F or higher  For urgent or emergent issues, a gastroenterologist can be reached at any hour by calling [phone]. Do not use MyChart messaging for urgent concerns.    DIET:  We do recommend a small meal at first, but then you may proceed to your regular diet.  Drink plenty of fluids but you should avoid alcoholic beverages for 24 hours.  ACTIVITY:  You should plan to take it  easy for the rest of today and you should NOT DRIVE or use heavy machinery until tomorrow (because of the sedation medicines used during the test).    FOLLOW UP: Our staff will call the number listed on your records the next business day following your procedure.  We will call around 7:15- 8:00 am to check on you and address any questions or concerns that you may have regarding the information given to you following your procedure. If we do not reach you, we will leave a message.     If any biopsies were taken you will be contacted by phone or by letter within the next 1-3 weeks.  Please call us  at [phone] if you have not heard about the biopsies in 3 weeks.    SIGNATURES/CONFIDENTIALITY: You and/or your care partner have signed paperwork which will be entered into your electronic medical record.  These signatures attest to the fact that that the information above on your After Visit Summary has been reviewed and is understood.  Full responsibility of the confidentiality of this discharge information lies with you and/or your care-partner.

## 2023-11-15 NOTE — Progress Notes (Signed)
 Report to PACU, RN, vss, BBS= Clear.

## 2023-11-15 NOTE — Progress Notes (Signed)
 Pt's states no medical or surgical changes since previsit or office visit.

## 2023-11-16 ENCOUNTER — Telehealth: Payer: Self-pay

## 2023-11-16 NOTE — Telephone Encounter (Signed)
  Follow up Call-     11/15/2023    1:04 PM  Call back number  Post procedure Call Back phone  # [phone]  Permission to leave phone message Yes     Patient questions:  Do you have a fever, pain , or abdominal swelling? No. Pain Score  0 *  Have you tolerated food without any problems? Yes.    Have you been able to return to your normal activities? Yes.    Do you have any questions about your discharge instructions: Diet   No. Medications  No. Follow up visit  No.  Do you have questions or concerns about your Care? No.  Actions: * If pain score is 4 or above: No action needed, pain <4.

## 2023-11-21 ENCOUNTER — Ambulatory Visit: Admitting: Obstetrics and Gynecology

## 2023-11-21 ENCOUNTER — Other Ambulatory Visit (HOSPITAL_COMMUNITY)
Admission: RE | Admit: 2023-11-21 | Discharge: 2023-11-21 | Disposition: A | Discharge status: Home / Self Care | Source: Ambulatory Visit | Attending: Obstetrics and Gynecology | Admitting: Obstetrics and Gynecology

## 2023-11-21 ENCOUNTER — Other Ambulatory Visit (INDEPENDENT_AMBULATORY_CARE_PROVIDER_SITE_OTHER)

## 2023-11-21 ENCOUNTER — Encounter: Payer: Self-pay | Admitting: Obstetrics and Gynecology

## 2023-11-21 ENCOUNTER — Telehealth: Payer: Self-pay

## 2023-11-21 VITALS — BP 126/76 | HR 70 | Ht 61.0 in | Wt 223.0 lb

## 2023-11-21 DIAGNOSIS — Z01419 Encounter for gynecological examination (general) (routine) without abnormal findings: Secondary | ICD-10-CM | POA: Diagnosis not present

## 2023-11-21 DIAGNOSIS — Z1501 Genetic susceptibility to malignant neoplasm of breast: Secondary | ICD-10-CM

## 2023-11-21 DIAGNOSIS — Z1509 Genetic susceptibility to other malignant neoplasm: Secondary | ICD-10-CM

## 2023-11-21 MED ORDER — COVID-19 MRNA VAC-TRIS(PFIZER) 30 MCG/0.3ML IM SUSY: 0.3000 mL | PREFILLED_SYRINGE | Freq: Once | INTRAMUSCULAR | 0 refills | Status: AC

## 2023-11-21 NOTE — Telephone Encounter (Signed)
 Will do. Thank you

## 2023-11-21 NOTE — Progress Notes (Addendum)
 38 y.o. y.o. female here for annual exam and PUS results She is engaged Recently found out she is brca1 positive and has EGFR variant for lung cancer She is planning on having a double mastectomy and will have RLH/BSO, cysto after. Has established with oncology. Found out about brca one with 50 year old sister tested positive as well.  Patient's last menstrual period was 11/14/2023. Period Cycle (Days): 28 Period Duration (Days): 4 -5 Period Pattern: Regular Menstrual Flow: Moderate Menstrual Control: Tampon Menstrual Control Change Freq (Hours): 2-4 Dysmenorrhea: (!) Moderate Dysmenorrhea Symptoms: Cramping, Headache H/o abnormal pap smears with 2 colposcopy procedures in 2016 normal after.  Gardesil completed NP AEX Est GYN Care, sister has breast cancer testing came back that she has the brca gene. PAP-05/13/19 Mammo-09/12/23 normal   Colonoscopy: 2025 normal repeat in 5 years Dermatology: repeat annually and has seen GYNONC: to do RLH/BSO in February Mastectomy in Jan Ca125 (7) Body mass index is 42.14 kg/m.  Blood pressure 126/76, pulse 70, height 5' 1 (1.549 m), weight 223 lb (101.2 kg), last menstrual period 11/14/2023, SpO2 98%.     Component Value Date/Time   DIAGPAP  05/13/2019 1712    - Negative for intraepithelial lesion or malignancy (NILM)   DIAGPAP  04/13/2017 0000    NEGATIVE FOR INTRAEPITHELIAL LESIONS OR MALIGNANCY.   DIAGPAP  04/13/2017 0000    FUNGAL ORGANISMS PRESENT CONSISTENT WITH CANDIDA SPP.   HPVHIGH Negative 05/13/2019 1712   ADEQPAP  05/13/2019 1712    Satisfactory for evaluation; transformation zone component PRESENT.   ADEQPAP  04/13/2017 0000    Satisfactory for evaluation  endocervical/transformation zone component PRESENT.    GYN HISTORY:    Component Value Date/Time   DIAGPAP  05/13/2019 1712    - Negative for intraepithelial lesion or malignancy (NILM)   DIAGPAP  04/13/2017 0000    NEGATIVE FOR INTRAEPITHELIAL LESIONS OR  MALIGNANCY.   DIAGPAP  04/13/2017 0000    FUNGAL ORGANISMS PRESENT CONSISTENT WITH CANDIDA SPP.   HPVHIGH Negative 05/13/2019 1712   ADEQPAP  05/13/2019 1712    Satisfactory for evaluation; transformation zone component PRESENT.   ADEQPAP  04/13/2017 0000    Satisfactory for evaluation  endocervical/transformation zone component PRESENT.    OB History  Gravida Para Term Preterm AB Living  1 1 1  0 0 1  SAB IAB Ectopic Multiple Live Births  0 0 0 0 1    # Outcome Date GA Lbr Len/2nd Weight Sex Type Anes PTL Lv  1 Term 07/29/14 [redacted]w[redacted]d 324:49 / 00:59 7 lb 11.6 oz (3.505 kg) F Vag-Spont EPI  LIV    Past Medical History:  Diagnosis Date   Abnormal Pap smear    12-29-09 ASCUS+HPV, 01-04-12 LSIL, 01-10-13 ASCUS +HPV   Allergy    Anemia    Asthma    Blood type O+    [facility]   Migraines    no aura   Obesity    Recurrent sinusitis     Past Surgical History:  Procedure Laterality Date   BUNIONECTOMY Left 03/15/2021   BUNIONECTOMY Right 06/14/2021   COLPOSCOPY     2011 & 2013 CIN1, 02-06-13    LAPAROSCOPIC GASTRIC SLEEVE RESECTION     nexplanon       inserted 11-20-14, 10-06-17 removed & new one inserted   tummy tuck     july 2023   WISDOM TOOTH EXTRACTION      Current Outpatient Medications on File Prior  to Visit  Medication Sig Dispense Refill   Multiple Vitamins-Minerals (MULTIVITAMIN PO) Take by mouth daily.     No current facility-administered medications on file prior to visit.    Social History   Socioeconomic History   Marital status: Single    Spouse name: n/a   Number of children: 0   Years of education: 14   Highest education level: Not on file  Occupational History   Occupation: Brewing technologist: [employer]   Occupation: Psychologist, occupational  Tobacco Use   Smoking status: Never   Smokeless tobacco: Never  Vaping Use   Vaping status: Never Used  Substance and Sexual Activity   Alcohol use: No    Alcohol/week: 0.0  standard drinks of alcohol   Drug use: No   Sexual activity: Yes    Partners: Male    Birth control/protection: Surgical    Comment: vasectomy  Other Topics Concern   Not on file  Social History Narrative   Lives alone.   Social Drivers of Corporate investment banker Strain: Not on file  Food Insecurity: No Food Insecurity (11/07/2023)   Hunger Vital Sign    Worried About Running Out of Food in the Last Year: Never true    Ran Out of Food in the Last Year: Never true  Transportation Needs: No Transportation Needs (11/07/2023)   PRAPARE - Administrator, Civil Service (Medical): No    Lack of Transportation (Non-Medical): No  Physical Activity: Not on file  Stress: Not on file  Social Connections: Not on file  Intimate Partner Violence: Not At Risk (11/07/2023)   Humiliation, Afraid, Rape, and Kick questionnaire    Fear of Current or Ex-Partner: No    Emotionally Abused: No    Physically Abused: No    Sexually Abused: No    Family History  Problem Relation Age of Onset   Diabetes Mother 28   Hypertension Mother    Heart disease Father 46       AMI x 3   Hypertension Father    Breast cancer Sister    BRCA 1/2 Sister    Colon polyps Brother    Breast cancer Maternal Grandmother 48 - 89   Throat cancer Paternal Grandfather    Prostate cancer Paternal Grandfather    Cancer Maternal Uncle    Ovarian cancer Other        paternal great aunt x2   Prostate cancer Other        paternal great uncle x2   Colon cancer Other        paternal great uncle   Uterine cancer Other 12       paternal great aunt   Uterine cancer Other    Liver cancer Other    Breast cancer Other 61 - 5       paternal first cousin once removed   Throat cancer Other        paternal first cousin once removed     Allergies  Allergen Reactions   Corticosteroids Anaphylaxis and Hives   Penicillins Anaphylaxis   Ursodiol Hives   Zocor [Simvastatin - High Dose] Hives and Rash       Patient's last menstrual period was Patient's last menstrual period was 11/14/2023.SABRA            Review of Systems Alls systems reviewed and are negative.     Physical Exam Constitutional:      Appearance: Normal appearance.  Genitourinary:  Vulva and urethral meatus normal.     No lesions in the vagina.     Right Labia: No rash, lesions or skin changes.    Left Labia: No lesions, skin changes or rash.    No vaginal discharge or tenderness.     No vaginal prolapse present.    No vaginal atrophy present.     Right Adnexa: not tender, not palpable and no mass present.    Left Adnexa: not tender, not palpable and no mass present.    No cervical motion tenderness or discharge.     Uterus is not enlarged, tender or irregular.  Breasts:    Right: Normal.     Left: Normal.  HENT:     Head: Normocephalic.  Neck:     Thyroid: No thyroid mass, thyromegaly or thyroid tenderness.  Cardiovascular:     Rate and Rhythm: Normal rate and regular rhythm.     Heart sounds: Normal heart sounds, S1 normal and S2 normal.  Pulmonary:     Effort: Pulmonary effort is normal.     Breath sounds: Normal breath sounds and air entry.  Abdominal:     General: There is no distension.     Palpations: Abdomen is soft. There is no mass.     Tenderness: There is no abdominal tenderness. There is no guarding or rebound.  Musculoskeletal:        General: Normal range of motion.     Cervical back: Full passive range of motion without pain, normal range of motion and neck supple. No tenderness.     Right lower leg: No edema.     Left lower leg: No edema.  Neurological:     Mental Status: She is alert.  Skin:    General: Skin is warm.  Psychiatric:        Mood and Affect: Mood normal.        Behavior: Behavior normal.        Thought Content: Thought content normal.  Vitals and nursing note reviewed. Exam conducted with a chaperone present.    PUS today 8.21cm uterus Normal size and  shape No myometrial masses Thin endometrial lining Normal ovaries with normal follicle pattern No adnexal masses No free fluid    A:        establish annual gyn exam with brca1 H/o abnormal pap smears, gardesil completed                             P:        Pap smear collected To get breast MRI with oncology department Labs: with PCP Encouraged healthy lifestyle practices Vaginal discharge: to get nuswab Flu UTD, desires covid booster: prescription sent  No follow-ups on file.  Ashley Barnes

## 2023-11-22 LAB — SURESWAB® ADVANCED VAGINITIS PLUS,TMA
C. trachomatis RNA, TMA: NOT DETECTED
CANDIDA SPECIES: NOT DETECTED
Candida glabrata: NOT DETECTED
N. gonorrhoeae RNA, TMA: NOT DETECTED
SURESWAB(R) ADV BACTERIAL VAGINOSIS(BV),TMA: NEGATIVE
TRICHOMONAS VAGINALIS (TV),TMA: NOT DETECTED

## 2023-11-22 NOTE — Telephone Encounter (Signed)
 Routing to [place], can you confirm if this was mailed?

## 2023-11-23 ENCOUNTER — Ambulatory Visit: Payer: Self-pay | Admitting: Obstetrics and Gynecology

## 2023-11-23 DIAGNOSIS — Z1501 Genetic susceptibility to malignant neoplasm of breast: Secondary | ICD-10-CM | POA: Diagnosis not present

## 2023-11-23 DIAGNOSIS — Z1509 Genetic susceptibility to other malignant neoplasm: Secondary | ICD-10-CM | POA: Diagnosis not present

## 2023-11-23 LAB — CYTOLOGY - PAP: Diagnosis: NEGATIVE

## 2023-12-02 ENCOUNTER — Ambulatory Visit
Admission: RE | Admit: 2023-12-02 | Discharge: 2023-12-02 | Disposition: A | Discharge status: Home / Self Care | Source: Ambulatory Visit | Attending: General Surgery | Admitting: General Surgery

## 2023-12-02 DIAGNOSIS — Z1509 Genetic susceptibility to other malignant neoplasm: Secondary | ICD-10-CM

## 2023-12-02 DIAGNOSIS — Z1239 Encounter for other screening for malignant neoplasm of breast: Secondary | ICD-10-CM | POA: Diagnosis not present

## 2023-12-02 DIAGNOSIS — R928 Other abnormal and inconclusive findings on diagnostic imaging of breast: Secondary | ICD-10-CM | POA: Diagnosis not present

## 2023-12-02 MED ORDER — GADOPICLENOL 0.5 MMOL/ML IV SOLN
10.0000 mL | Freq: Once | INTRAVENOUS | Status: AC | PRN
  Administered 2023-12-02: 10 mL via INTRAVENOUS

## 2023-12-04 ENCOUNTER — Ambulatory Visit: Payer: Self-pay | Admitting: General Surgery

## 2023-12-07 ENCOUNTER — Encounter (HOSPITAL_BASED_OUTPATIENT_CLINIC_OR_DEPARTMENT_OTHER): Payer: Self-pay | Admitting: Pulmonary Disease

## 2023-12-07 ENCOUNTER — Ambulatory Visit (HOSPITAL_BASED_OUTPATIENT_CLINIC_OR_DEPARTMENT_OTHER): Admitting: Pulmonary Disease

## 2023-12-07 VITALS — BP 105/71 | HR 79 | Ht 61.0 in | Wt 224.0 lb

## 2023-12-07 DIAGNOSIS — Z1509 Genetic susceptibility to other malignant neoplasm: Secondary | ICD-10-CM

## 2023-12-07 DIAGNOSIS — J019 Acute sinusitis, unspecified: Secondary | ICD-10-CM | POA: Diagnosis not present

## 2023-12-07 DIAGNOSIS — Z15060 Genetic susceptibility to colorectal cancer: Secondary | ICD-10-CM

## 2023-12-07 DIAGNOSIS — Z1501 Genetic susceptibility to malignant neoplasm of breast: Secondary | ICD-10-CM

## 2023-12-07 DIAGNOSIS — J45909 Unspecified asthma, uncomplicated: Secondary | ICD-10-CM | POA: Diagnosis not present

## 2023-12-07 DIAGNOSIS — Z1589 Genetic susceptibility to other disease: Secondary | ICD-10-CM

## 2023-12-07 DIAGNOSIS — Z15068 Genetic susceptibility to other malignant neoplasm of digestive system: Secondary | ICD-10-CM

## 2023-12-07 NOTE — Patient Instructions (Addendum)
 BRCA1 mutations are not a common driver of lung cancer, but pathogenic germline or somatic BRCA1 variants are present in a small subset of non-small cell lung cancer (NSCLC) cases, typically <2%.[1-2]   BRCA1-mutated NSCLC is most often adenocarcinoma and may occur in never-smokers at a higher rate than in the general NSCLC population.[3-4]   Therapeutically, NSCLC harboring BRCA1 mutations demonstrates increased sensitivity to platinum-based chemotherapy, with higher objective response rates and longer progression-free survival compared to BRCA wild-type NSCLC.[7-9]   The [employer] (NCCN) does not currently recommend routine BRCA1 testing for lung cancer, as the strongest evidence for BRCA1 risk and management pertains to breast, ovarian, pancreatic, and prostate cancers.[11]   In summary, BRCA1 mutations in lung cancer are rare but clinically relevant for platinum sensitivity. Their presence may also have prognostic implications for recurrence and survival.[5-8]  A comprehensive landscape of BRCA1 versus BRCA2 associated molecular alterations and survival outcome across 35 cancer types. Puccini A, Xiu J, Heeke A, et al. Journal of Clinical Oncology. 2021;39(Suppl 15):3120. doi:10.1200/JCO.2021.39.15_suppl.3120.  2. Prevalence and clinical significance of pathogenic germline mutations in homologous recombination repair genes in [place] lung cancer patients. Cena CINDERELLA Missy LITTIE Cesario CINDERELLA, et al. Journal of Clinical Oncology. 2022;40(Suppl 83):z79452. doi:10.1200/JCO.2022.40.16_suppl.z79452.  3. Histopathologic and demographic features of non-small cell lung cancer in patients with BRCA pathogenic germline variants. Bleiberg B, Cesario CHRISTELLA Geroge JAYSON, et al. Journal of Clinical Oncology. 2025;43(Suppl 83):89426. doi:10.1200/JCO.2025.43.16_suppl.69.  4. Germline Mutations in Young Non-Smoking Women With Lung Adenocarcinoma. Laurinda FERNS, Katainen R, Sipil LJ, et al. Lung Cancer  [place], [place]). 2018;122:76-82. doi:10.1016/j.lungcan.2018.05.027.  7. Clinical Characteristics, Response to Platinum-Based Chemotherapy and Poly (Adenosine Phosphate-Ribose) Polymerase Inhibitors in Advanced Lung Cancer Patients Harboring BRCA Mutations. Derl JINNY Arcelia CHRISTELLA Thelbert CINDERELLA, et al. Cancers. 2023;15(19):4733. doi:10.3390/cancers15194733.  8.Homologous Recombination in Lung Cancer, Germline and Somatic Mutations, Clinical and Phenotype Characterization. Collin LITTIE Thelbert CINDERELLA, Zick A, et al. Lung Cancer [place], [place]). 2019;137:48-51. doi:10.1016/j.lungcan.2019.09.008.  9. Clinical characteristics, response to poly (adenosine phosphate-ribose) polymerase inhibitors and platinum chemotherapy in patients with lung cancer harboring BRCA mutations. Derl JINNY Joellyn JONELLE, Zick A, et al. Journal of Clinical Oncology. 2023;41(Suppl 16):e21036. doi:10.1200/JCO.2023.41.16_suppl.z78963.  11. Genetic/Familial High-Risk Assessment: Breast, Ovarian, Pancreatic, and Prostate. National Comprehensive Cancer Network Updated 2023-09-14 Practice Guideline

## 2023-12-07 NOTE — Progress Notes (Signed)
 Subjective:   PATIENT ID: Ashley Barnes GENDER: female DOB: [birth year], MRN: [id number]  Chief Complaint  Patient presents with   Establish Care         Reason for Visit: New consult for +BRCA1  [name] is a 38 year old femal never smoker with allergic rhinitis, asthma, recurrent sinusitis, migraines and obesity s/p sleeve gastroplasty who presents as new consult to establish pulmonary care.  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 She is planning on a double mastectomy in January 2026. Has history of asthma and recurrent sinus issues but denies current issues. Denies shortness of breath, cough or wheezing. No limitations in activity.  Social History: Never smoker  Environmental exposures: None  I have personally reviewed patient's past medical/family/social history, allergies, current medications.  Past Medical History:  Diagnosis Date   Abnormal Pap smear    12-29-09 ASCUS+HPV, 01-04-12 LSIL, 01-10-13 ASCUS +HPV   Allergy    Anemia    Asthma    Blood type O+    on donor card from [facility]   Migraines    no aura   Obesity    Recurrent sinusitis      Family History  Problem Relation Age of Onset   Diabetes Mother 34   Hypertension Mother    Heart disease Father 82       AMI x 3   Hypertension Father    Breast cancer Sister    BRCA 1/2 Sister    Colon polyps Brother    Breast cancer Maternal Grandmother 20 - 89   Throat cancer Paternal Grandfather    Prostate cancer Paternal Grandfather    Cancer Maternal Uncle    Ovarian cancer Other        paternal great aunt x2   Prostate cancer Other        paternal great uncle x2   Colon cancer Other        paternal great uncle   Uterine cancer Other 3       paternal great aunt   Uterine cancer Other    Liver cancer Other    Breast cancer Other 41 - 34       paternal first cousin once removed   Throat  cancer Other        paternal first cousin once removed     Social History   Occupational History   Occupation: Brewing technologist: [employer]   Occupation: Psychologist, occupational  Tobacco Use   Smoking status: Never   Smokeless tobacco: Never  Vaping Use   Vaping status: Never Used  Substance and Sexual Activity   Alcohol use: No    Alcohol/week: 0.0 standard drinks of alcohol   Drug use: No   Sexual activity: Yes    Partners: Male    Birth control/protection: Surgical    Comment: vasectomy    Allergies  Allergen Reactions   Corticosteroids Anaphylaxis and Hives   Penicillins Anaphylaxis   Ursodiol Hives   Zocor [Simvastatin - High Dose] Hives and Rash     Outpatient Medications Prior to Visit  Medication Sig Dispense Refill   Multiple Vitamins-Minerals (MULTIVITAMIN PO) Take by mouth daily.     No facility-administered medications prior to visit.    Review of Systems  Constitutional:  Negative for chills, diaphoresis, fever, malaise/fatigue and weight loss.  HENT:  Negative for congestion.  Respiratory:  Negative for cough, hemoptysis, sputum production, shortness of breath and wheezing.   Cardiovascular:  Negative for chest pain, palpitations and leg swelling.     Objective:   Vitals:   12/07/23 0822  BP: 105/71  Pulse: 79  SpO2: 100%  Weight: 224 lb (101.6 kg)  Height: 5' 1 (1.549 m)   SpO2: 100 %  Physical Exam: General: Well-appearing, no acute distress HENT: [address], AT Eyes: EOMI, no scleral icterus Respiratory: Clear to auscultation bilaterally.  No crackles, wheezing or rales Cardiovascular: RRR, -M/R/G, no JVD Extremities:-Edema,-tenderness Neuro: AAO x4, CNII-XII grossly intact Psych: Normal mood, normal affect  Data Reviewed:  Imaging: None on file  PFT: None on file  Labs: CBC    Component Value Date/Time   WBC 7.2 03/25/2015 0908   RBC 5.13 (H) 03/25/2015 0908   HGB 13.6 03/25/2015 0908   HGB 12.7 01/10/2013 0920   HCT  40.7 03/25/2015 0908   PLT 327 03/25/2015 0908   MCV 79.3 03/25/2015 0908   MCH 26.5 03/25/2015 0908   MCHC 33.4 03/25/2015 0908   RDW 15.8 (H) 03/25/2015 0908        Assessment & Plan:   Discussion: 38 year old femal never smoker with allergic rhinitis, asthma, recurrent sinusitis, migraines and obesity s/p sleeve gastroplasty who presents with recent diagnosis of +BRCA1 mutation. We reviewed recent literature regarding the relationship to BRCA1 and lung cancer as noted below. Addressed questions regarding surveillance if indicated and signs and symptoms of lung cancer.  She currently is low risk with no indications for lung screening in absence of tobacco history.   Handout given: BRCA1 mutations are not a common driver of lung cancer, but pathogenic germline or somatic BRCA1 variants are present in a small subset of non-small cell lung cancer (NSCLC) cases, typically <2%.[1-2]   BRCA1-mutated NSCLC is most often adenocarcinoma and may occur in never-smokers at a higher rate than in the general NSCLC population.[3-4]   Therapeutically, NSCLC harboring BRCA1 mutations demonstrates increased sensitivity to platinum-based chemotherapy, with higher objective response rates and longer progression-free survival compared to BRCA wild-type NSCLC.[7-9]   The [employer] (NCCN) does not currently recommend routine BRCA1 testing for lung cancer, as the strongest evidence for BRCA1 risk and management pertains to breast, ovarian, pancreatic, and prostate cancers.[11]   In summary, BRCA1 mutations in lung cancer are rare but clinically relevant for platinum sensitivity. Their presence may also have prognostic implications for recurrence and survival.[5-8]  A comprehensive landscape of BRCA1 versus BRCA2 associated molecular alterations and survival outcome across 35 cancer types. Puccini A, Xiu J, Heeke A, et al. Journal of Clinical Oncology. 2021;39(Suppl 15):3120.  doi:10.1200/JCO.2021.39.15_suppl.3120.  2. Prevalence and clinical significance of pathogenic germline mutations in homologous recombination repair genes in [place] lung cancer patients. Cena CINDERELLA Missy LITTIE Cesario CINDERELLA, et al. Journal of Clinical Oncology. 2022;40(Suppl 83):z79452. doi:10.1200/JCO.2022.40.16_suppl.z79452.  3. Histopathologic and demographic features of non-small cell lung cancer in patients with BRCA pathogenic germline variants. Bleiberg B, Cesario CHRISTELLA Geroge JAYSON, et al. Journal of Clinical Oncology. 2025;43(Suppl 83):89426. doi:10.1200/JCO.2025.43.16_suppl.23.  4. Germline Mutations in Young Non-Smoking Women With Lung Adenocarcinoma. Laurinda FERNS, Katainen R, Sipil LJ, et al. Lung Cancer [place], [place]). 2018;122:76-82. doi:10.1016/j.lungcan.2018.05.027.  7. Clinical Characteristics, Response to Platinum-Based Chemotherapy and Poly (Adenosine Phosphate-Ribose) Polymerase Inhibitors in Advanced Lung Cancer Patients Harboring BRCA Mutations. Derl JINNY Arcelia CHRISTELLA Thelbert CINDERELLA, et al. Cancers. 2023;15(19):4733. doi:10.3390/cancers15194733.  8.Homologous Recombination in Lung Cancer, Germline and Somatic Mutations, Clinical and Phenotype Characterization. [place]ouri  LITTIE Thelbert GRADE, Zick A, et al. Lung Cancer ([place], [place]). 2019;137:48-51. doi:10.1016/j.lungcan.2019.09.008.  9. Clinical characteristics, response to poly (adenosine phosphate-ribose) polymerase inhibitors and platinum chemotherapy in patients with lung cancer harboring BRCA mutations. Derl JINNY Joellyn JONELLE, Zick A, et al. Journal of Clinical Oncology. 2023;41(Suppl 16):e21036. doi:10.1200/JCO.2023.41.16_suppl.z78963.  11. Genetic/Familial High-Risk Assessment: Breast, Ovarian, Pancreatic, and Prostate. National Comprehensive Cancer Network Updated 2023-09-14 Practice Guideline  Health Maintenance Immunization History  Administered Date(s) Administered   HPV Quadrivalent 01/05/2010, 03/07/2010, 07/06/2010    Hepatitis B 03/07/1998   Influenza, Mdck, Trivalent,PF 6+ MOS(egg free) 12/03/2021   Influenza-Unspecified 10/05/2012, 01/15/2014, 12/24/2020   Moderna Covid-19 Fall Seasonal Vaccine 75yrs & older 12/03/2021   Moderna Sars-Covid-2 Vaccination 06/14/2019, 07/12/2019, 02/06/2020, 12/24/2020   Tdap 03/07/2005, 03/07/2014, 05/14/2014   CT Lung Screen - not indicated  No orders of the defined types were placed in this encounter. No orders of the defined types were placed in this encounter.   Return if symptoms worsen or fail to improve.  I have spent a total time of 35-minutes on the day of the appointment reviewing prior documentation, coordinating care and discussing medical diagnosis and plan with the patient/family. Imaging, labs and tests included in this note have been reviewed and interpreted independently by me.  Welcome Fults Slater Staff, MD [facility] 12/07/2023 6:36 PM

## 2023-12-21 ENCOUNTER — Other Ambulatory Visit (HOSPITAL_COMMUNITY)
Admission: RE | Admit: 2023-12-21 | Discharge: 2023-12-21 | Disposition: A | Discharge status: Home / Self Care | Payer: Self-pay | Source: Ambulatory Visit | Attending: Medical Genetics | Admitting: Medical Genetics

## 2024-01-08 LAB — GENECONNECT MOLECULAR SCREEN: Genetic Analysis Overall Interpretation: POSITIVE — AB

## 2024-01-09 ENCOUNTER — Telehealth: Payer: Self-pay | Admitting: Medical Genetics

## 2024-01-09 DIAGNOSIS — Z1501 Genetic susceptibility to malignant neoplasm of breast: Secondary | ICD-10-CM

## 2024-01-09 NOTE — Telephone Encounter (Addendum)
 [place] [facility] Positive Result Note 01/09/2024 11:48 AM  FIRST ATTEMPT: Confirmed I was speaking with Ashley Barnes [id number] by using name and DOB. Informed participant the reason for this call is to provide results for the above study. Results revealed Hereditary Breast and Ovarian Syndrome. Genetic counseling was offered and participant declined a visit at this time as she had genetic counseling previously. All questions were answered, and participant was thanked for their time and support of the above study. Participant was encouraged to contact [facility] if they have any further questions or concerns.

## 2024-02-09 ENCOUNTER — Other Ambulatory Visit: Payer: Self-pay | Admitting: General Surgery

## 2024-02-09 DIAGNOSIS — Z1501 Genetic susceptibility to malignant neoplasm of breast: Secondary | ICD-10-CM | POA: Diagnosis not present

## 2024-02-09 DIAGNOSIS — Z1509 Genetic susceptibility to other malignant neoplasm: Secondary | ICD-10-CM | POA: Diagnosis not present

## 2024-02-20 ENCOUNTER — Telehealth: Payer: Self-pay | Admitting: *Deleted

## 2024-02-20 NOTE — Telephone Encounter (Signed)
 Patient called the office and left a message wanting to schedule her surgery with Dr. Rogelio for February and states her mastectomy is scheduled for 03/12/2024. Per Dr. Rogelio progress note patient was to be scheduled for a virtual pre-op visit on 2/26. Message relayed to providers.

## 2024-02-23 NOTE — Telephone Encounter (Signed)
 Spoke with patient and offered her February 17 th for surgery with Dr. Rogelio. Pt agreed and thanked the office for calling. Advised patient that once we have her scheduled she will have a pre-op appt. With provider and a pre-admission appointment at [facility] and they will be reaching out to schedule. Pt verbalized understanding and thanked the office for calling.

## 2024-02-27 ENCOUNTER — Telehealth: Payer: Self-pay | Admitting: *Deleted

## 2024-02-27 NOTE — Telephone Encounter (Signed)
 Spoke with the patient and gave the pre op appts

## 2024-02-28 DIAGNOSIS — Z1501 Genetic susceptibility to malignant neoplasm of breast: Secondary | ICD-10-CM | POA: Diagnosis not present

## 2024-02-28 DIAGNOSIS — Z1509 Genetic susceptibility to other malignant neoplasm: Secondary | ICD-10-CM | POA: Diagnosis not present

## 2024-02-28 DIAGNOSIS — Z1589 Genetic susceptibility to other disease: Secondary | ICD-10-CM | POA: Diagnosis not present

## 2024-02-28 DIAGNOSIS — Z15068 Genetic susceptibility to other malignant neoplasm of digestive system: Secondary | ICD-10-CM | POA: Diagnosis not present

## 2024-02-28 NOTE — H&P (Signed)
 Subjective Patient ID: Ashley Barnes is a 38 y.o. female.     HPI   Returns for follow up discussion breast reconstruction. Patient diagnosed with BRCA1 2025 following sister's diagnosis. Her sister is also my patient. Patient planning risk reducing mastectomies.   Screening MMG  09/2023 benign. MRI 9.2025 benign   Current 42 C   Highest weight 285 lb. Underwent sleeve gastroplasty 2019. Lowest weight following this 185 lb. Regained to current, weight stable over last year.   She has had prior brachioplasty and Fleur de Lys abdominoplasty with Dr. Creola. Latter complicated by dehiscence open wound and depressed scar.   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 Lives with fiance, adult brother and 85 yo son.   Review of Systems   Objective Physical Exam  Cardiovascular: Normal rate, regular rhythm and normal heart sounds.    Pulmonary/Chest Effort normal and breath sounds normal.    Skin   Fitzpatrick 3   Abd midline scar with widened tethered scar supraumbilical, low transverse scar MS: upper arm scars flat faded   Lymph: no palpable axillary adenopathy   Breasts: no palpable masses, grade 3 ptosis bilateral SN to nipple R 32.5 L 33 cm BW R 20 L 20 cm Nipple to IMF R 10 L 10 cm Bilateral lateral chest soft tissue rolls   Assessment/Plan BRCA1 positive Hx sleeve gastroplasty     Plan bilateral mastectomies with tissue expander acellular dermis reconstruction.    Reviewed options prosthesis and provided brochure for [facility] [place]. Discussed immediate vs delayed, autologous vs implant based reconstruction. Reviewed incisions, drains, OR length, hospital stay and recovery for each. Discussed process of expansion and implant based risks including rupture, MRI surveillance for silicone implants, infection requiring surgery or removal, contracture. Discussed asymmetry one can expect with implant unilateral and natural breast on opposite. Patient is  not a candidate for abdominal based reconstruction given prior abdominoplasty. If she desires purely autologous reconstruction recommend consultation with microsurgeon.    Reviewed SSM vs NSM, both will be asensate and not stimulate. Reviewed with both risks mastectomy flap necrosis requiring additional surgery. Presently she is not a candidate for NSM given ptosis. We discussed if she desires this can plan staged approach with mastopexy followed by NSM. Patient declines this. Reviewed this surgery will not affect lateral chest soft tissue rolls and would require direct excision of these in future.    Discussed use of acellular dermis in reconstruction, cadaveric source, incorporation over several weeks, risk that if has seroma or infection can act as additional nidus for infection if not incorporated. This is off label use of ADM.   Discussed prepectoral vs sub pectoral reconstruction. Discussed with patient and benefit of this is no animation deformity, may be less pain. Risk may be more visible rippling over upper poles, greater need of ADM. Reviewed pre pectoral would require larger amount acellular dermis, more drains. Discussed any type reconstruction also risks long term displacement implant and visible rippling. If prepectoral counseled I would recommend she be comfortable with silicone implants as more options that have less rippling. She agrees to prepectoral placement.   Reviewed additional risks including but not limited to risks mastectomy flap necrosis requiring additional surgery, seroma, hematoma, asymmetry, need to additional procedures, fat necrosis, DVT/PE, damage to adjacent structures, cardiopulmonary complications.   Drain teaching completed. Rx for oxycodone  Bactrim and robaxin given.  Earlis Ranks, MD [facility]  Office/ physician access line after hours [phone]

## 2024-03-04 ENCOUNTER — Encounter (HOSPITAL_BASED_OUTPATIENT_CLINIC_OR_DEPARTMENT_OTHER): Payer: Self-pay | Admitting: General Surgery

## 2024-03-04 ENCOUNTER — Other Ambulatory Visit: Payer: Self-pay

## 2024-03-04 NOTE — Progress Notes (Signed)
 Gave patient CHG soap with instructions, patient verbalized understanding.      Enhanced Recovery after Surgery for Orthopedics Enhanced Recovery after Surgery is a protocol used to improve the stress on your body and your recovery after surgery.  Patient Instructions  The night before surgery:  No food after midnight. ONLY clear liquids after midnight  The day of surgery (if you do NOT have diabetes):  Drink ONE (1) Pre-Surgery Clear Ensure as directed.   This drink was given to you during your hospital  pre-op appointment visit. The pre-op nurse will instruct you on the time to drink the  Pre-Surgery Ensure depending on your surgery time. Finish the drink at the designated time by the pre-op nurse.  Nothing else to drink after completing the  Pre-Surgery Clear Ensure.

## 2024-03-11 NOTE — H&P (Signed)
 " PROVIDER:  JINA CLAIR NEPHEW, MD   MRN: [id number] DOB: 1985/08/16 Subjective    Plan Chief Complaint: Follow-up       History of Present Illness: Ashley Barnes is a 39 y.o. female who is seen today for BRCA1.   Patient was referred by genetic counselor Burnard Cha for recent diagnosis of BRCA1.  Patient sister is my patient.  The patient's family was referred also for genetic counseling.  Patient got her first mammogram at age 56 when this started and this was negative with a breast density of a.  Now that patient is BRCA 1, patient does plan hysterectomy and BSO.  Patient had a single daughter at age 14 and does not plan any further children.    Patient denies any prior history of cancer.  Patient sisters diagnosed with cancer age 18.  They do not have any other first degree relatives with cancer.   Family cancer history - sister Mliss Dunker has breast cancer and is BRCA-1.  Maternal grandmother did have breast cancer but was diagnosed later in life.  A paternal grandfather had prostate cancer.   Interval History    History of Present Illness Ashley Barnes is a 39 year old female who presents for a pre-operative consultation regarding breast tissue removal and reconstruction.   She is scheduled for breast tissue removal and reconstruction. She reports that her recent MRI was normal. The procedure will involve removing the breast tissue up to the collarbone, along the side of the breastbone, and to the front of the latissimus muscle, leaving a small amount of tissue under the skin to prevent skin necrosis. The nipples will be removed, with the option for tattooing or using prosthetic nipples post-surgery.   She has a history of planning for a hysterectomy and oophorectomy, which she intends to schedule within a three to four week window post-surgery.   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   She reports a rash under her breast, likely due to sweating from riding a spin bike, which she has been doing consistently during her time off. She uses antifungal powder to manage the rash.   Review of Systems: A complete review of systems was obtained from the patient.  I have reviewed this information and discussed as appropriate with the patient.  See HPI as well for other ROS.   Review of Systems  All other systems reviewed and are negative.       Medical History: Past Medical History      Past Medical History:  Diagnosis Date   Anemia     Anxiety     Asthma, unspecified asthma severity, unspecified whether complicated, unspecified whether persistent (HHS-HCC)          Problem List     Patient Active Problem List  Diagnosis   BRCA1 positive        Past Surgical History       Past Surgical History:  Procedure Laterality Date   LAPAROSCOPIC SLEEVE GASTRECTOMY   05/08/2017    Dr. JINNY. Dasher   Arm lift surgery   12/2019   Tummy Tuck surgery   09/2020   Bunionectomy   2023    January and April        Allergies      Allergies  Allergen Reactions   Corticosteroids (Glucocorticoids) Anaphylaxis and Hives   Penicillins Anaphylaxis and Hives  Simvastatin Hives and Rash   Ursodiol Hives        Medications Ordered Prior to Encounter        Current Outpatient Medications on File Prior to Visit  Medication Sig Dispense Refill   escitalopram  oxalate (LEXAPRO ) 10 MG tablet Take 10 mg by mouth once daily       MULTIVITAMIN ORAL Take by mouth once daily        No current facility-administered medications on file prior to visit.        Family History       Family History  Problem Relation Age of Onset   Diabetes Mother     High blood pressure (Hypertension) Mother     Hyperlipidemia (Elevated cholesterol) Father     High blood pressure (Hypertension) Father     Coronary Artery Disease (Blocked arteries around heart) Father     Breast  cancer Sister     Obesity Brother     Hyperlipidemia (Elevated cholesterol) Brother     High blood pressure (Hypertension) Brother     Diabetes Brother     Breast cancer Maternal Grandmother     Prostate cancer Paternal Grandfather     Throat cancer Paternal Grandfather          Tobacco Use History  Social History       Tobacco Use  Smoking Status Never  Smokeless Tobacco Never        Social History  Social History        Socioeconomic History   Marital status: Single  Tobacco Use   Smoking status: Never   Smokeless tobacco: Never  Substance and Sexual Activity   Alcohol use: Never   Drug use: Never    Social Drivers of Health        Food Insecurity: No Food Insecurity (11/07/2023)    Received from [facility]    Hunger Vital Sign     Within the past 12 months, you worried that your food would run out before you got the money to buy more.: Never true     Within the past 12 months, the food you bought just didn't last and you didn't have money to get more.: Never true  Transportation Needs: No Transportation Needs (11/07/2023)    Received from [facility] - [facility]     In the past 12 months, has lack of transportation kept you from medical appointments or from getting medications?: No     In the past 12 months, has lack of transportation kept you from meetings, work, or from getting things needed for daily living?: No  Housing Stability: Unknown (11/07/2023)    Housing Stability Vital Sign     Homeless in the Last Year: No        Objective:         Vitals:      PainSc: 0-No pain    There is no height or weight on file to calculate BMI.   Head:   Normocephalic and atraumatic.  Eyes:    Conjunctivae are normal. Pupils are equal, round, and reactive to light. No scleral icterus.  Neck:   Normal range of motion. Neck supple. No tracheal deviation present. No thyromegaly present.  Resp:No respiratory distress, normal effort. Breast: Dense  tissue centrally.  No palpable masses either side.  No lymphadenopathy.  Breasts are ptotic bilaterally.  Relatively symmetric in size.  No nipple retraction or nipple discharge appreciated.  No cervical adenopathy is present  either. Very mild superficial rash under both breasts.  No appreciable skin breakdown. Abd:      Abdomen is soft, non distended and non tender. No masses are palpable.  There is no rebound and no guarding.  Neurological: Alert and oriented to person, place, and time. Coordination normal.  Skin:    Skin is warm and dry. No rash noted. No diaphoretic. No erythema. No pallor.  Psychiatric: Normal mood and affect. Normal behavior. Judgment and thought content normal.      Labs, Imaging and Diagnostic Testing:   None avail     Assessment and Plan:    Assessment Diagnoses and all orders for this visit:   BRCA1 positive     Assessment & Plan Prophylactic bilateral mastectomy for BRCA1 genetic susceptibility Scheduled for prophylactic bilateral mastectomy due to BRCA1 genetic susceptibility. MRI normal. Procedure involves removing breast tissue while preserving skin and nipples. Main risks include bleeding (1%) and infection. Pathology will be examined. No lymph nodes are planned to be removed, but pt advised that there may be 1-2 in the axillary tail. Post-operative care includes monitoring for complications. Pain management with narcotics and muscle relaxants as well as tylenol  and NSAIDS. Physical therapy planned for recovery. - Monitor for bleeding and infection post-operatively. - Send breast tissue for pathology examination.   Rash under breasts Developed intertrigo vs mild candidiasis due to sweating under the breast. Rash partially healed. - Recommend antifungal powder such as Gold Bond or nystatin to keep area dry.     "

## 2024-03-11 NOTE — Anesthesia Preprocedure Evaluation (Addendum)
"                                    Anesthesia Evaluation  Patient identified by MRN, date of birth, ID band Patient awake    Reviewed: Allergy & Precautions, NPO status , Patient's Chart, lab work & pertinent test results  History of Anesthesia Complications (+) PONV and history of anesthetic complications  Airway Mallampati: II  TM Distance: >3 FB Neck ROM: Full    Dental no notable dental hx.    Pulmonary asthma    Pulmonary exam normal        Cardiovascular negative cardio ROS Normal cardiovascular exam     Neuro/Psych  Headaches    GI/Hepatic negative GI ROS, Neg liver ROS,,,  Endo/Other    Class 3 obesity  Renal/GU negative Renal ROS     Musculoskeletal negative musculoskeletal ROS (+)    Abdominal   Peds  Hematology negative hematology ROS (+)   Anesthesia Other Findings   Reproductive/Obstetrics BRCA 1                              Anesthesia Physical Anesthesia Plan  ASA: 3  Anesthesia Plan: General   Post-op Pain Management: Tylenol  PO (pre-op)*, Regional block*, Dilaudid  IV and Precedex   Induction: Intravenous  PONV Risk Score and Plan: 4 or greater and Treatment may vary due to age or medical condition, Ondansetron , Midazolam , Scopolamine  patch - Pre-op, Propofol  infusion, TIVA and Droperidol   Airway Management Planned: Oral ETT  Additional Equipment: None  Intra-op Plan:   Post-operative Plan: Extubation in OR  Informed Consent: I have reviewed the patients History and Physical, chart, labs and discussed the procedure including the risks, benefits and alternatives for the proposed anesthesia with the patient or authorized representative who has indicated his/her understanding and acceptance.     Dental advisory given  Plan Discussed with: CRNA  Anesthesia Plan Comments:          Anesthesia Quick Evaluation  "

## 2024-03-12 ENCOUNTER — Ambulatory Visit (HOSPITAL_BASED_OUTPATIENT_CLINIC_OR_DEPARTMENT_OTHER)
Admission: RE | Admit: 2024-03-12 | Discharge: 2024-03-13 | Disposition: A | Discharge status: Home / Self Care | Attending: General Surgery | Admitting: General Surgery

## 2024-03-12 ENCOUNTER — Encounter (HOSPITAL_BASED_OUTPATIENT_CLINIC_OR_DEPARTMENT_OTHER): Payer: Self-pay | Admitting: Anesthesiology

## 2024-03-12 ENCOUNTER — Encounter (HOSPITAL_BASED_OUTPATIENT_CLINIC_OR_DEPARTMENT_OTHER): Payer: Self-pay | Admitting: General Surgery

## 2024-03-12 ENCOUNTER — Ambulatory Visit (HOSPITAL_BASED_OUTPATIENT_CLINIC_OR_DEPARTMENT_OTHER): Payer: Self-pay | Admitting: Anesthesiology

## 2024-03-12 ENCOUNTER — Other Ambulatory Visit: Payer: Self-pay

## 2024-03-12 ENCOUNTER — Encounter (HOSPITAL_BASED_OUTPATIENT_CLINIC_OR_DEPARTMENT_OTHER): Admission: RE | Disposition: A | Payer: Self-pay | Source: Home / Self Care | Attending: General Surgery

## 2024-03-12 DIAGNOSIS — Z01818 Encounter for other preprocedural examination: Secondary | ICD-10-CM

## 2024-03-12 DIAGNOSIS — Z1501 Genetic susceptibility to malignant neoplasm of breast: Secondary | ICD-10-CM | POA: Insufficient documentation

## 2024-03-12 DIAGNOSIS — Z803 Family history of malignant neoplasm of breast: Secondary | ICD-10-CM | POA: Diagnosis not present

## 2024-03-12 DIAGNOSIS — Z1509 Genetic susceptibility to other malignant neoplasm: Secondary | ICD-10-CM

## 2024-03-12 HISTORY — PX: TISSUE EXPANDER PLACEMENT: SHX2530

## 2024-03-12 HISTORY — PX: TOTAL MASTECTOMY: SHX6129

## 2024-03-12 HISTORY — DX: Nausea with vomiting, unspecified: R11.2

## 2024-03-12 HISTORY — PX: BREAST RECONSTRUCTION: SHX9

## 2024-03-12 HISTORY — DX: Other specified postprocedural states: Z98.890

## 2024-03-12 LAB — POCT PREGNANCY, URINE: Preg Test, Ur: NEGATIVE

## 2024-03-12 MED ORDER — ONDANSETRON 4 MG PO TBDP: 4.0000 mg | ORAL_TABLET | Freq: Four times a day (QID) | ORAL | Status: DC | PRN

## 2024-03-12 MED ORDER — OXYCODONE HCL 5 MG PO TABS: 5.0000 mg | ORAL_TABLET | ORAL | Status: DC | PRN

## 2024-03-12 MED ORDER — ONDANSETRON HCL 4 MG/2ML IJ SOLN
INTRAMUSCULAR | Status: AC
  Filled 2024-03-12: qty 2

## 2024-03-12 MED ORDER — ROCURONIUM BROMIDE 100 MG/10ML IV SOLN
INTRAVENOUS | Status: DC | PRN
  Administered 2024-03-12: 70 mg via INTRAVENOUS
  Administered 2024-03-12: 20 mg via INTRAVENOUS
  Administered 2024-03-12: 10 mg via INTRAVENOUS
  Administered 2024-03-12 (×3): 20 mg via INTRAVENOUS
  Administered 2024-03-12: 10 mg via INTRAVENOUS

## 2024-03-12 MED ORDER — CLONIDINE HCL (ANALGESIA) 100 MCG/ML EP SOLN
EPIDURAL | Status: DC | PRN
  Administered 2024-03-12 (×2): 50 ug

## 2024-03-12 MED ORDER — DROPERIDOL 2.5 MG/ML IJ SOLN
INTRAMUSCULAR | Status: DC | PRN
  Administered 2024-03-12: .625 mg via INTRAVENOUS

## 2024-03-12 MED ORDER — EPHEDRINE SULFATE (PRESSORS) 25 MG/5ML IV SOSY
PREFILLED_SYRINGE | INTRAVENOUS | Status: DC | PRN
  Administered 2024-03-12 (×2): 10 mg via INTRAVENOUS

## 2024-03-12 MED ORDER — ACETAMINOPHEN 500 MG PO TABS
1000.0000 mg | ORAL_TABLET | Freq: Four times a day (QID) | ORAL | Status: DC
  Administered 2024-03-12 – 2024-03-13 (×2): 1000 mg via ORAL
  Filled 2024-03-12 (×2): qty 2

## 2024-03-12 MED ORDER — MIDAZOLAM HCL 5 MG/5ML IJ SOLN
INTRAMUSCULAR | Status: DC | PRN
  Administered 2024-03-12: 2 mg via INTRAVENOUS

## 2024-03-12 MED ORDER — TRANEXAMIC ACID 1000 MG/10ML IV SOLN
Status: DC | PRN
  Administered 2024-03-12: 3000 mg via TOPICAL

## 2024-03-12 MED ORDER — FENTANYL CITRATE (PF) 100 MCG/2ML IJ SOLN
50.0000 ug | Freq: Once | INTRAMUSCULAR | Status: AC
  Administered 2024-03-12: 50 ug via INTRAVENOUS

## 2024-03-12 MED ORDER — CELECOXIB 200 MG PO CAPS
200.0000 mg | ORAL_CAPSULE | Freq: Two times a day (BID) | ORAL | Status: DC
  Administered 2024-03-12: 200 mg via ORAL
  Filled 2024-03-12: qty 1

## 2024-03-12 MED ORDER — PROPOFOL 1000 MG/100ML IV EMUL
INTRAVENOUS | Status: AC
  Filled 2024-03-12: qty 400

## 2024-03-12 MED ORDER — SCOPOLAMINE 1 MG/3DAYS TD PT72
MEDICATED_PATCH | TRANSDERMAL | Status: AC
  Filled 2024-03-12: qty 1

## 2024-03-12 MED ORDER — IRRISEPT - 450ML BOTTLE WITH 0.05% CHG IN STERILE WATER, USP 99.95% OPTIME
TOPICAL | Status: DC | PRN
  Administered 2024-03-12: 450 mL

## 2024-03-12 MED ORDER — CHLORHEXIDINE GLUCONATE CLOTH 2 % EX PADS: 6.0000 | MEDICATED_PAD | Freq: Once | CUTANEOUS | Status: DC

## 2024-03-12 MED ORDER — CIPROFLOXACIN IN D5W 400 MG/200ML IV SOLN
INTRAVENOUS | Status: AC
  Filled 2024-03-12: qty 200

## 2024-03-12 MED ORDER — ONDANSETRON HCL 4 MG/2ML IJ SOLN: 4.0000 mg | Freq: Four times a day (QID) | INTRAMUSCULAR | Status: DC | PRN

## 2024-03-12 MED ORDER — EPHEDRINE 5 MG/ML INJ
INTRAVENOUS | Status: AC
  Filled 2024-03-12: qty 5

## 2024-03-12 MED ORDER — ROCURONIUM BROMIDE 10 MG/ML (PF) SYRINGE
PREFILLED_SYRINGE | INTRAVENOUS | Status: AC
  Filled 2024-03-12: qty 10

## 2024-03-12 MED ORDER — DROPERIDOL 2.5 MG/ML IJ SOLN: 0.6250 mg | Freq: Once | INTRAMUSCULAR | Status: DC | PRN

## 2024-03-12 MED ORDER — FENTANYL CITRATE (PF) 100 MCG/2ML IJ SOLN
INTRAMUSCULAR | Status: AC
  Filled 2024-03-12: qty 2

## 2024-03-12 MED ORDER — MIDAZOLAM HCL 2 MG/2ML IJ SOLN
INTRAMUSCULAR | Status: AC
  Filled 2024-03-12: qty 2

## 2024-03-12 MED ORDER — PROCHLORPERAZINE MALEATE 10 MG PO TABS: 10.0000 mg | ORAL_TABLET | Freq: Four times a day (QID) | ORAL | Status: DC | PRN

## 2024-03-12 MED ORDER — DIPHENHYDRAMINE HCL 12.5 MG/5ML PO ELIX: 12.5000 mg | ORAL_SOLUTION | Freq: Four times a day (QID) | ORAL | Status: DC | PRN

## 2024-03-12 MED ORDER — DIPHENHYDRAMINE HCL 50 MG/ML IJ SOLN: 12.5000 mg | Freq: Four times a day (QID) | INTRAMUSCULAR | Status: DC | PRN

## 2024-03-12 MED ORDER — POVIDONE-IODINE 10 % EX SOLN
CUTANEOUS | Status: DC | PRN
  Administered 2024-03-12: 1 via TOPICAL

## 2024-03-12 MED ORDER — TRAMADOL HCL 50 MG PO TABS: 50.0000 mg | ORAL_TABLET | Freq: Four times a day (QID) | ORAL | Status: DC | PRN

## 2024-03-12 MED ORDER — FENTANYL CITRATE (PF) 100 MCG/2ML IJ SOLN
INTRAMUSCULAR | Status: DC | PRN
  Administered 2024-03-12: 50 ug via INTRAVENOUS
  Administered 2024-03-12: 100 ug via INTRAVENOUS
  Administered 2024-03-12: 50 ug via INTRAVENOUS

## 2024-03-12 MED ORDER — ONDANSETRON HCL 4 MG/2ML IJ SOLN
INTRAMUSCULAR | Status: DC | PRN
  Administered 2024-03-12: 4 mg via INTRAVENOUS

## 2024-03-12 MED ORDER — PROPOFOL 500 MG/50ML IV EMUL
INTRAVENOUS | Status: DC | PRN
  Administered 2024-03-12: 150 ug/kg/min via INTRAVENOUS

## 2024-03-12 MED ORDER — LIDOCAINE 2% (20 MG/ML) 5 ML SYRINGE
INTRAMUSCULAR | Status: AC
  Filled 2024-03-12: qty 5

## 2024-03-12 MED ORDER — MIDAZOLAM HCL (PF) 2 MG/2ML IJ SOLN
2.0000 mg | Freq: Once | INTRAMUSCULAR | Status: AC
  Administered 2024-03-12: 2 mg via INTRAVENOUS

## 2024-03-12 MED ORDER — SIMETHICONE 80 MG PO CHEW: 40.0000 mg | CHEWABLE_TABLET | Freq: Four times a day (QID) | ORAL | Status: DC | PRN

## 2024-03-12 MED ORDER — ESCITALOPRAM OXALATE 10 MG PO TABS
15.0000 mg | ORAL_TABLET | Freq: Every day | ORAL | Status: DC
  Administered 2024-03-12: 15 mg via ORAL
  Filled 2024-03-12: qty 1.5

## 2024-03-12 MED ORDER — BUPIVACAINE-EPINEPHRINE (PF) 0.25% -1:200000 IJ SOLN
INTRAMUSCULAR | Status: DC | PRN
  Administered 2024-03-12 (×2): 30 mL via PERINEURAL

## 2024-03-12 MED ORDER — METHOCARBAMOL 500 MG PO TABS: 500.0000 mg | ORAL_TABLET | Freq: Four times a day (QID) | ORAL | Status: DC | PRN

## 2024-03-12 MED ORDER — PROCHLORPERAZINE EDISYLATE 10 MG/2ML IJ SOLN: 5.0000 mg | Freq: Four times a day (QID) | INTRAMUSCULAR | Status: DC | PRN

## 2024-03-12 MED ORDER — ENOXAPARIN SODIUM 40 MG/0.4ML IJ SOSY
40.0000 mg | PREFILLED_SYRINGE | INTRAMUSCULAR | Status: DC
  Administered 2024-03-13: 40 mg via SUBCUTANEOUS
  Filled 2024-03-12: qty 0.4

## 2024-03-12 MED ORDER — DROPERIDOL 2.5 MG/ML IJ SOLN
INTRAMUSCULAR | Status: AC
  Filled 2024-03-12: qty 2

## 2024-03-12 MED ORDER — MELATONIN 3 MG PO TABS
3.0000 mg | ORAL_TABLET | Freq: Every evening | ORAL | Status: DC | PRN
  Administered 2024-03-12: 3 mg via ORAL
  Filled 2024-03-12: qty 1

## 2024-03-12 MED ORDER — MORPHINE SULFATE (PF) 4 MG/ML IV SOLN: 1.0000 mg | INTRAVENOUS | Status: DC | PRN

## 2024-03-12 MED ORDER — PROPOFOL 10 MG/ML IV BOLUS
INTRAVENOUS | Status: DC | PRN
  Administered 2024-03-12: 200 mg via INTRAVENOUS

## 2024-03-12 MED ORDER — ACETAMINOPHEN 500 MG PO TABS
1000.0000 mg | ORAL_TABLET | ORAL | Status: AC
  Administered 2024-03-12: 1000 mg via ORAL

## 2024-03-12 MED ORDER — HYDROMORPHONE HCL 1 MG/ML IJ SOLN: 0.2500 mg | INTRAMUSCULAR | Status: DC | PRN

## 2024-03-12 MED ORDER — SENNA 8.6 MG PO TABS
1.0000 | ORAL_TABLET | Freq: Two times a day (BID) | ORAL | Status: DC
  Administered 2024-03-12: 8.6 mg via ORAL
  Filled 2024-03-12: qty 1

## 2024-03-12 MED ORDER — SUGAMMADEX SODIUM 200 MG/2ML IV SOLN
INTRAVENOUS | Status: DC | PRN
  Administered 2024-03-12: 200 mg via INTRAVENOUS

## 2024-03-12 MED ORDER — LIDOCAINE HCL (CARDIAC) PF 100 MG/5ML IV SOSY
PREFILLED_SYRINGE | INTRAVENOUS | Status: DC | PRN
  Administered 2024-03-12: 100 mg via INTRAVENOUS

## 2024-03-12 MED ORDER — KCL IN DEXTROSE-NACL 20-5-0.45 MEQ/L-%-% IV SOLN
INTRAVENOUS | Status: AC
  Filled 2024-03-12: qty 1000

## 2024-03-12 MED ORDER — LACTATED RINGERS IV SOLN: INTRAVENOUS | Status: DC

## 2024-03-12 MED ORDER — PROPOFOL 500 MG/50ML IV EMUL
INTRAVENOUS | Status: AC
  Filled 2024-03-12: qty 50

## 2024-03-12 MED ORDER — CIPROFLOXACIN IN D5W 400 MG/200ML IV SOLN
400.0000 mg | Freq: Two times a day (BID) | INTRAVENOUS | Status: AC
  Administered 2024-03-12: 400 mg via INTRAVENOUS
  Filled 2024-03-12: qty 200

## 2024-03-12 MED ORDER — SCOPOLAMINE 1 MG/3DAYS TD PT72
1.0000 | MEDICATED_PATCH | TRANSDERMAL | Status: DC
  Administered 2024-03-12: 1 mg via TRANSDERMAL

## 2024-03-12 MED ORDER — CIPROFLOXACIN IN D5W 400 MG/200ML IV SOLN
400.0000 mg | INTRAVENOUS | Status: AC
  Administered 2024-03-12: 400 mg via INTRAVENOUS

## 2024-03-12 MED ORDER — GABAPENTIN 100 MG PO CAPS
100.0000 mg | ORAL_CAPSULE | Freq: Two times a day (BID) | ORAL | Status: DC
  Administered 2024-03-12: 100 mg via ORAL

## 2024-03-12 MED ORDER — GABAPENTIN 100 MG PO CAPS
ORAL_CAPSULE | ORAL | Status: AC
  Filled 2024-03-12: qty 1

## 2024-03-12 MED ORDER — TRANEXAMIC ACID 1000 MG/10ML IV SOLN
INTRAVENOUS | Status: AC
  Filled 2024-03-12: qty 60

## 2024-03-12 MED ORDER — ACETAMINOPHEN 500 MG PO TABS
ORAL_TABLET | ORAL | Status: AC
  Filled 2024-03-12: qty 2

## 2024-03-12 MED ORDER — 0.9 % SODIUM CHLORIDE (POUR BTL) OPTIME
TOPICAL | Status: DC | PRN
  Administered 2024-03-12: 1000 mL

## 2024-03-12 MED ORDER — KETOROLAC TROMETHAMINE 30 MG/ML IJ SOLN
INTRAMUSCULAR | Status: DC | PRN
  Administered 2024-03-12: 30 mg via INTRAVENOUS

## 2024-03-12 NOTE — Progress Notes (Signed)
Assisted Dr. Stephannie Peters with left, right, pectoralis, ultrasound guided block. Side rails up, monitors on throughout procedure. See vital signs in flow sheet. Tolerated Procedure well.

## 2024-03-12 NOTE — Transfer of Care (Signed)
 Immediate Anesthesia Transfer of Care Note  Patient: Ashley Barnes  Procedure(s) Performed: BILATERAL SKIN SPARING MASTECTOMIES (Bilateral: Breast) BREAST RECONSTRUCTION WITH TISSUE EXPANDERS AND ACELLULAR DERMIS (Bilateral: Chest) INSERTION TISSUE EXPANDERS (Bilateral: Chest)  Patient Location: PACU  Anesthesia Type:General  Level of Consciousness: awake  Airway & Oxygen Therapy: Patient Spontanous Breathing and Patient connected to face mask oxygen  Post-op Assessment: Report given to RN and Post -op Vital signs reviewed and stable  Post vital signs: Reviewed  Last Vitals:  Vitals Value Taken Time  BP 110/57 03/12/24 13:32  Temp    Pulse 81 03/12/24 13:35  Resp 16 03/12/24 13:35  SpO2 100 % 03/12/24 13:35  Vitals shown include unfiled device data.  Last Pain:  Vitals:   03/12/24 0700  TempSrc: Temporal  PainSc: 0-No pain         Complications: No notable events documented.

## 2024-03-12 NOTE — Anesthesia Procedure Notes (Signed)
 Anesthesia Regional Block: Pectoralis block   Pre-Anesthetic Checklist: , timeout performed,  Correct Patient, Correct Site, Correct Laterality,  Correct Procedure, Correct Position, site marked,  Risks and benefits discussed,  Pre-op evaluation,  At surgeon's request and post-op pain management  Laterality: Left  Prep: Maximum Sterile Barrier Precautions used, chloraprep       Needles:  Injection technique: Single-shot  Needle Type: Echogenic Stimulator Needle     Needle Length: 9cm  Needle Gauge: 22     Additional Needles:   Procedures:,,,, ultrasound used (permanent image in chart),,    Narrative:  Start time: 03/12/2024 7:51 AM End time: 03/12/2024 7:53 AM Injection made incrementally with aspirations every 5 mL.  Performed by: Personally  Anesthesiologist: Paul Lamarr BRAVO, MD  Additional Notes: Risks, benefits, and alternative discussed. Patient gave consent for procedure. Patient prepped and draped in sterile fashion. Sedation administered, patient remains easily responsive to voice. Relevant anatomy identified with ultrasound guidance. Local anesthetic given in 5cc increments with no signs or symptoms of intravascular injection. No pain or paraesthesias with injection. Patient monitored throughout procedure with no signs of LAST or immediate complications. Tolerated well. Ultrasound image placed in chart.  LANEY Paul, MD

## 2024-03-12 NOTE — Op Note (Signed)
 Operative Note   DATE OF OPERATION: 1.6.2026  LOCATION: [facility]-outpatient  SURGICAL DIVISION: Plastic Surgery  PREOPERATIVE DIAGNOSES:  1. BRCA1  POSTOPERATIVE DIAGNOSES:  same  PROCEDURE:  1. Bilateral breast reconstruction with tissue expanders 2. Acellular dermis (Alloderm) to bilateral chest 600cm2  SURGEON: Earlis Ranks MD MBA  ASSISTANT: none  ANESTHESIA:  General.   EBL: 100 ml for entire procedure  COMPLICATIONS: None immediate.   INDICATIONS FOR PROCEDURE:  The patient, Ashley Barnes, is a 39 y.o. female born on 1986-01-05, is here for immediate prepectoral tissue expander based reconstruction following skin reduction pattern mastectomies.   FINDINGS: Natrelle 133S FV-13-T 500 ml tissue expanders placed bilateral. Initial fill volume 180 ml air bilateral. RIGHT SN [id number] LEFT SN [id number]  DESCRIPTION OF PROCEDURE:  The patient was marked with the patient in the preoperative area to mark sternal notch, chest midline, anterior axillary lines and inframammary folds. Patient was marked for skin reduction mastectomy with most superior portion nipple areola marked on breast meridian. Vertical limbs marked at lateral border areolae and set at 10 cm length. The patient was taken to the operating room. SCDs were placed and IV antibiotics were given. Foley catheter placed. The patient's operative site was prepped and draped in a sterile fashion. A time out was performed and all information was confirmed to be correct. I assisted in mastectomiues with retraction and exposure. Following completion of mastectomies, reconstruction began on right side. In supine position, the lateral limbs for resection marked and area over lower pole preserved as inferiorly based dermal pedicle. Skin de epithelialized in this area.     The cavity was irrigated with saline solution containing Ancef, gentamicin, and betadine . Hemostasis was ensured. A 19 Fr drain was placed in subcutaneous  position laterally and a 15 Fr drain placed along inframammary fold. Each secured to skin with 2-0 nylon. The tissue expanders were prepared on back table prior in insertion. The expander was filled with air. Perforated acellular dermis was  draped over anterior surface expander. The ADM was then secured to itself over posterior surface of expander with 4-0 chromic. Redundant folds acellular dermis excised so that the ADM lay flat without folds over air filled expander. The expander was secured to medial insertion pectoralis with a 0 vicryl. The superior and lateral tabs also secured to pectoralis muscle with 0-vicryl. The ADM was secured to pectoralis muscle and chest wall along inferior border at inframammary fold with 0 Strattafix suture. Laterally the mastectomy flap over posterior axillary line was advanced anteriorly and the subcutaneous tissue and superficial fascia was secured to chest wall with 0-vicryl. The inferiorly based dermal pedicle was redraped superiorly over expander and acellular dermis and secured to pectoralis with interrupted 0-vicryl. Skin closure completed with 3-0 vicryl in fascial layer and 4-0 vicryl in dermis. Skin closure completed with 4-0 monocryl subcuticular and tissue adhesive.   I then directed my attention to left chest where similar irrigation and drain placement completed. The prepared expander with ADM secured over anterior surface was placed in left chest and tabs secured to chest wall and pectoralis muscle with 0- vicryl suture. The acellular dermis at inframammary fold was secured to chest wall with 0 Strattafix suture. Laterally the mastectomy flap over posterior axillary line was advanced anteriorly and the subcutaneous tissue and superficial fascia was secured to chest wall with 0-vicryl. The inferiorly based dermal pedicle was redraped superiorly over expander and acellular dermis and secured to pectoralis with interrupted 0-vicryl. Skin closure completed with  3-0  vicryl in fascial layer and 4-0 vicryl in dermis. Skin closure completed with 4-0 monocryl subcuticular and tissue adhesive. Tegaderms applied bilateral, followed by dry dressing and breast.  The patient was allowed to wake from anesthesia, extubated and taken to the recovery room in satisfactory condition.   SPECIMENS: none  DRAINS: 15 and 19 Fr JP in rignt and left subcutaneous chest  Earlis Ranks, MD [facility]  Office/ physician access line after hours [phone]

## 2024-03-12 NOTE — Anesthesia Postprocedure Evaluation (Signed)
"   Anesthesia Post Note  Patient: Ashley Barnes  Procedure(s) Performed: BILATERAL SKIN SPARING MASTECTOMIES (Bilateral: Breast) BREAST RECONSTRUCTION WITH TISSUE EXPANDERS AND ACELLULAR DERMIS (Bilateral: Chest) INSERTION TISSUE EXPANDERS (Bilateral: Chest)     Patient location during evaluation: PACU Anesthesia Type: General Level of consciousness: awake and alert Pain management: pain level controlled Vital Signs Assessment: post-procedure vital signs reviewed and stable Respiratory status: spontaneous breathing, nonlabored ventilation and respiratory function stable Cardiovascular status: blood pressure returned to baseline Postop Assessment: no apparent nausea or vomiting Anesthetic complications: no   No notable events documented.  Last Vitals:  Vitals:   03/12/24 1415 03/12/24 1425  BP: (!) 104/50 (!) 101/58  Pulse: 66 75  Resp: 19 (!) 23  Temp:    SpO2: 92% 92%    Last Pain:  Vitals:   03/12/24 1415  TempSrc:   PainSc: 0-No pain   Pain Goal:                   Vertell Row      "

## 2024-03-12 NOTE — Interval H&P Note (Signed)
 History and Physical Interval Note:  03/12/2024 8:20 AM  Ashley Barnes  has presented today for surgery, with the diagnosis of BRCA 1.  The various methods of treatment have been discussed with the patient and family. After consideration of risks, benefits and other options for treatment, the patient has consented to  Procedures with comments: BILATERAL SKIN SPARING MASTECTOMIES as a surgical intervention.  The patient's history has been reviewed, patient examined, no change in status, stable for surgery.  I have reviewed the patient's chart and labs.  Questions were answered to the patient's satisfaction.     Jina Nephew

## 2024-03-12 NOTE — Interval H&P Note (Signed)
 History and Physical Interval Note:  03/12/2024 7:45 AM  Ashley Barnes  has presented today for surgery, with the diagnosis of BRCA 1.  The various methods of treatment have been discussed with the patient and family. After consideration of risks, benefits and other options for treatment, the patient has consented to  Procedures with comments: MASTECTOMY, SIMPLE (Bilateral) - GEN w/PEC BLOCK BILATERAL SKIN SPARING MASTECTOMIES BREAST RECONSTRUCTION (Bilateral) INSERTION TISSUE EXPANDERS (Bilateral) - ALLODERM as a surgical intervention.  The patient's history has been reviewed, patient examined, no change in status, stable for surgery.  I have reviewed the patient's chart and labs.  Questions were answered to the patient's satisfaction.     Earlis [name]

## 2024-03-12 NOTE — Op Note (Addendum)
 Bilateral prophylactic skin sparing mastectomies  Indications: This patient presents with history of BRCA1 mutation  Pre-operative Diagnosis: BRCA1 and sister with breast cancer  Post-operative Diagnosis: same  Surgeon: ARON SHOULDERS   Assistant:  Earlis Ranks, MD  Anesthesia: General endotracheal anesthesia and pectoral block  ASA Class: 3  Procedure Details  The patient was seen in the Holding Room. The risks, benefits, complications, treatment options, and expected outcomes were discussed with the patient. The possibilities of reaction to medication, pulmonary aspiration, bleeding, infection, the need for additional procedures, failure to diagnose a condition, and creating a complication requiring transfusion or operation were discussed with the patient. The patient concurred with the proposed plan, giving informed consent.  The site of surgery properly noted/marked. The patient was taken to Operating Room # 6, SCDs were placed, and general anesthesia was induced.  The bilateral breast, and chest were prepped and draped in standard fashion. A time out was then performed.  The patient was  identified as Ashley Barnes and the procedure verified as bilateral skin sparing mastectomies followed by reconstruction.    The plastic surgeon marked the desired incisions. The borders of the breast were identified and marked.  The right side was addressed first.  The incision was made with the #10 blade.  Mastectomy hooks were used to provide elevation of the skin edges, and the cautery was used to create the mastectomy flaps.  The dissection was taken to the fascia of the pectoralis major.  The penetrating vessels were clipped as needed.  The flap was taken medially to the lateral sternal border, superiorly to the inferior border of the clavicle, inferiorly to the inframammary fold and laterally to the border of the latissimus. Lighted retractors were also used to assist with visualization.  The breast  was taken off the pectoralis major including the pectoralis fascia and the axillary tail was marked with a 2-0 silk.  Additional tissue was taken along the superolateral region to make the flaps thinner and more even.    The wound was then meticulously inspected for hemostasis.  A TXA soaked lap was placed into the wound.    Attention was then directed to the contralateral side.  This was performed similarly.      The patient was left with Dr. Ranks to perform the reconstruction.  Small count was performed and was correct.       Findings: No gross findings concerning for malignancy.   Estimated Blood Loss: min          Drains: per Dr. Ranks                Specimens: right breast, left breast.           Complications:  None; patient tolerated the procedure well.         Disposition: remains in OR with Dr. Ranks for completion of the procedure.           Condition: stable

## 2024-03-12 NOTE — Anesthesia Procedure Notes (Signed)
 Anesthesia Regional Block: Pectoralis block   Pre-Anesthetic Checklist: , timeout performed,  Correct Patient, Correct Site, Correct Laterality,  Correct Procedure, Correct Position, site marked,  Risks and benefits discussed,  Pre-op evaluation,  At surgeon's request and post-op pain management  Laterality: Right  Prep: Maximum Sterile Barrier Precautions used, chloraprep       Needles:  Injection technique: Single-shot  Needle Type: Echogenic Stimulator Needle     Needle Length: 9cm  Needle Gauge: 21     Additional Needles:   Narrative:  Start time: 03/12/2024 7:47 AM End time: 03/12/2024 7:51 AM Injection made incrementally with aspirations every 5 mL. Anesthesiologist: Paul Lamarr BRAVO, MD  Additional Notes: Risks, benefits, and alternative discussed. Patient gave consent for procedure. Patient prepped and draped in sterile fashion. Sedation administered, patient remains easily responsive to voice. Relevant anatomy identified with ultrasound guidance. Local anesthetic given in 5cc increments with no signs or symptoms of intravascular injection. No pain or paraesthesias with injection. Patient monitored throughout procedure with no signs of LAST or immediate complications. Tolerated well. Ultrasound image placed in chart. LANEY Paul, MD

## 2024-03-12 NOTE — Anesthesia Procedure Notes (Signed)
 Procedure Name: Intubation Date/Time: 03/12/2024 8:45 AM  Performed by: Pam Macario BROCKS, CRNAPre-anesthesia Checklist: Patient identified, Emergency Drugs available, Suction available, Patient being monitored and Timeout performed Patient Re-evaluated:Patient Re-evaluated prior to induction Oxygen Delivery Method: Circle system utilized Preoxygenation: Pre-oxygenation with 100% oxygen Induction Type: IV induction Ventilation: Mask ventilation without difficulty Laryngoscope Size: Mac and 3 Grade View: Grade II Tube type: Oral Tube size: 7.0 mm Number of attempts: 1 Airway Equipment and Method: Stylet and Oral airway Placement Confirmation: ETT inserted through vocal cords under direct vision, positive ETCO2, breath sounds checked- equal and bilateral and CO2 detector Secured at: 23 cm Tube secured with: Tape Dental Injury: Teeth and Oropharynx as per pre-operative assessment

## 2024-03-13 ENCOUNTER — Encounter (HOSPITAL_BASED_OUTPATIENT_CLINIC_OR_DEPARTMENT_OTHER): Payer: Self-pay | Admitting: General Surgery

## 2024-03-13 DIAGNOSIS — Z1501 Genetic susceptibility to malignant neoplasm of breast: Secondary | ICD-10-CM | POA: Diagnosis not present

## 2024-03-13 NOTE — Discharge Summary (Signed)
 Physician Discharge Summary  Patient ID: Ashley Barnes MRN: [id number] DOB/AGE: 41-May-1987 40 y.o.  Admit date: 03/12/2024 Discharge date: 03/13/2024  Admission Diagnoses: BRCA1 mutation positive  Discharge Diagnoses:  Principal Problem:   BRCA1 gene mutation positive   Discharged Condition: stable  Hospital Course: Post operatively patient did well with pain controlled, tolerating diet and ambulatory with minimal assist. Instructed on bathing and drain care.  Treatments: surgery: bilateral mastectomies with tissue expander acellular dermis reconstruction 1.6.2026  Discharge Exam: Blood pressure (!) 99/59, pulse 61, temperature 98.8 F (37.1 C), resp. rate 16, height 5' 1 (1.549 m), weight 100.9 kg, SpO2 100%. Incision/Wound: chest soft incision intact Tegaderms in place drains serosanguinous  Disposition: Discharge disposition: 01-Home or Self Care       Discharge Instructions     Call MD for:  redness, tenderness, or signs of infection (pain, swelling, bleeding, redness, odor or green/yellow discharge around incision site)   Complete by: As directed    Call MD for:  temperature >100.5   Complete by: As directed    Discharge instructions   Complete by: As directed    Ok to remove dressings and shower am 1.8.2026. Soap and water  ok, pat Tegaderms dry. Do not remove Tegaderms. No creams or ointments over incisions. Do not let drains dangle in shower, attach to lanyard or similar.Strip and record drains twice daily and bring log to clinic visit.  Breast binder or soft compression bra all other times.  Ok to raise arms above shoulders for bathing and dressing.  No house yard work or exercise until cleared by MD.   Patient received  all Rx preop. Recommend ibuprofen  with meals to aid with pain control. Also ok to use Tylenol  for pain. Recommend Miralax or Dulcolax as needed for constipation. Ice packs to chest for comfort.   Driving Restrictions   Complete by: As directed     No driving for 2 weeks then no driving if taking prescription pain medication   Lifting restrictions   Complete by: As directed    No lifting > 5-10 lbs until cleared by MD   Resume previous diet   Complete by: As directed       Allergies as of 03/13/2024       Reactions   Corticosteroids Anaphylaxis, Hives   Penicillins Anaphylaxis   Ursodiol Hives   Zocor [simvastatin - High Dose] Hives, Rash        Medication List     TAKE these medications    escitalopram  10 MG tablet Commonly known as: LEXAPRO  Take 15 mg by mouth daily.   MULTIVITAMIN PO Take by mouth daily.        Follow-up Information     Aron Shoulders, MD Follow up in 2 week(s).   Specialty: General Surgery Contact information: [address] [place] [address] [phone]         Arelia Filippo, MD Follow up in 1 week(s).   Specialty: Plastic Surgery Why: as scheduled Contact information: [address] [place] [address] [phone]                 Signed: Filippo Arelia 03/13/2024, 7:27 AM

## 2024-03-14 ENCOUNTER — Ambulatory Visit: Payer: Self-pay | Admitting: Obstetrics and Gynecology

## 2024-03-14 LAB — SURGICAL PATHOLOGY

## 2024-04-16 ENCOUNTER — Encounter (HOSPITAL_COMMUNITY): Admission: RE | Admit: 2024-04-16

## 2024-04-17 ENCOUNTER — Inpatient Hospital Stay: Admitting: Obstetrics & Gynecology

## 2024-04-18 ENCOUNTER — Inpatient Hospital Stay

## 2024-04-23 ENCOUNTER — Ambulatory Visit (HOSPITAL_COMMUNITY): Admit: 2024-04-23 | Admitting: Obstetrics & Gynecology

## 2024-04-23 DIAGNOSIS — Z1509 Genetic susceptibility to other malignant neoplasm: Secondary | ICD-10-CM

## 2024-04-23 DIAGNOSIS — Z1501 Genetic susceptibility to malignant neoplasm of breast: Secondary | ICD-10-CM

## 2024-05-08 ENCOUNTER — Inpatient Hospital Stay: Admitting: Obstetrics & Gynecology

## 2024-10-23 ENCOUNTER — Ambulatory Visit: Admitting: Obstetrics and Gynecology

## 2024-10-28 ENCOUNTER — Ambulatory Visit: Admitting: Physician Assistant
